# Patient Record
Sex: Female | Born: 1945
Health system: Southern US, Community
[De-identification: ages and names within clinical notes are randomized; demographics above are authoritative.]

## PROBLEM LIST (undated history)

## (undated) DIAGNOSIS — E785 Hyperlipidemia, unspecified: Secondary | ICD-10-CM

## (undated) DIAGNOSIS — K219 Gastro-esophageal reflux disease without esophagitis: Secondary | ICD-10-CM

## (undated) DIAGNOSIS — F32A Depression, unspecified: Secondary | ICD-10-CM

## (undated) DIAGNOSIS — H269 Unspecified cataract: Secondary | ICD-10-CM

## (undated) DIAGNOSIS — E0500 Thyrotoxicosis with diffuse goiter without thyrotoxic crisis or storm: Secondary | ICD-10-CM

## (undated) DIAGNOSIS — M1990 Unspecified osteoarthritis, unspecified site: Secondary | ICD-10-CM

## (undated) DIAGNOSIS — I10 Essential (primary) hypertension: Secondary | ICD-10-CM

## (undated) DIAGNOSIS — T7840XA Allergy, unspecified, initial encounter: Secondary | ICD-10-CM

## (undated) DIAGNOSIS — F419 Anxiety disorder, unspecified: Secondary | ICD-10-CM

## (undated) HISTORY — DX: Unspecified cataract: H26.9

## (undated) HISTORY — DX: Gastro-esophageal reflux disease without esophagitis: K21.9

## (undated) HISTORY — DX: Essential (primary) hypertension: I10

## (undated) HISTORY — DX: Allergy, unspecified, initial encounter: T78.40XA

## (undated) HISTORY — DX: Depression, unspecified: F32.A

## (undated) HISTORY — DX: Unspecified osteoarthritis, unspecified site: M19.90

## (undated) HISTORY — DX: Anxiety disorder, unspecified: F41.9

## (undated) HISTORY — DX: Hyperlipidemia, unspecified: E78.5

## (undated) HISTORY — DX: Thyrotoxicosis with diffuse goiter without thyrotoxic crisis or storm: E05.00

---

## 1998-10-23 ENCOUNTER — Other Ambulatory Visit: Admission: RE | Admit: 1998-10-23 | Discharge: 1998-10-23 | Payer: Self-pay | Admitting: Gynecology

## 1999-11-18 ENCOUNTER — Other Ambulatory Visit: Admission: RE | Admit: 1999-11-18 | Discharge: 1999-11-18 | Payer: Self-pay | Admitting: Gynecology

## 2001-02-16 ENCOUNTER — Other Ambulatory Visit: Admission: RE | Admit: 2001-02-16 | Discharge: 2001-02-16 | Payer: Self-pay | Admitting: Gynecology

## 2002-05-31 ENCOUNTER — Other Ambulatory Visit: Admission: RE | Admit: 2002-05-31 | Discharge: 2002-05-31 | Payer: Self-pay | Admitting: Gynecology

## 2002-11-23 ENCOUNTER — Ambulatory Visit (HOSPITAL_COMMUNITY): Admission: RE | Admit: 2002-11-23 | Discharge: 2002-11-23 | Payer: Self-pay | Admitting: Endocrinology

## 2003-06-26 ENCOUNTER — Other Ambulatory Visit: Admission: RE | Admit: 2003-06-26 | Discharge: 2003-06-26 | Payer: Self-pay | Admitting: Gynecology

## 2003-11-04 HISTORY — PX: POLYPECTOMY: SHX149

## 2003-12-05 ENCOUNTER — Ambulatory Visit (HOSPITAL_COMMUNITY): Admission: RE | Admit: 2003-12-05 | Discharge: 2003-12-05 | Payer: Self-pay | Admitting: Family Medicine

## 2004-09-17 ENCOUNTER — Other Ambulatory Visit: Admission: RE | Admit: 2004-09-17 | Discharge: 2004-09-17 | Payer: Self-pay | Admitting: Gynecology

## 2009-09-20 ENCOUNTER — Encounter (INDEPENDENT_AMBULATORY_CARE_PROVIDER_SITE_OTHER): Payer: Self-pay | Admitting: *Deleted

## 2009-10-15 ENCOUNTER — Encounter (INDEPENDENT_AMBULATORY_CARE_PROVIDER_SITE_OTHER): Payer: Self-pay | Admitting: *Deleted

## 2009-10-16 ENCOUNTER — Ambulatory Visit: Payer: Self-pay | Admitting: Gastroenterology

## 2009-11-03 HISTORY — PX: TOTAL KNEE ARTHROPLASTY: SHX125

## 2009-11-06 ENCOUNTER — Ambulatory Visit: Payer: Self-pay | Admitting: Gastroenterology

## 2009-11-08 ENCOUNTER — Encounter: Payer: Self-pay | Admitting: Gastroenterology

## 2010-12-05 NOTE — Procedures (Signed)
Summary: Colonoscopy  Patient: Heather Wilson Note: All result statuses are Final unless otherwise noted.  Tests: (1) Colonoscopy (COL)   COL Colonoscopy           DONE     [facility]     [address]     [place], [place]  [address]           COLONOSCOPY PROCEDURE REPORT           PATIENT:  Heather Wilson, Heather Wilson  MR#:  [id number]     BIRTHDATE:  04/08/1946, 63 yrs. old  GENDER:  female           ENDOSCOPIST:  Judie Petit T. Russella Dar, MD, [facility]           PROCEDURE DATE:  11/06/2009     PROCEDURE:  Colonoscopy with snare polypectomy, and with hot     biopsy     ASA CLASS:  Class II     INDICATIONS:  1) follow-up of polyp, adenomatous polyp, 2.2005.           MEDICATIONS:   Fentanyl 75 mcg IV, Versed 8 mg IV           DESCRIPTION OF PROCEDURE:   After the risks benefits and     alternatives of the procedure were thoroughly explained, informed     consent was obtained.  Digital rectal exam was performed and     revealed no abnormalities.   The LB PCF-H180AL [id number] endoscope     was introduced through the anus and advanced to the cecum, which     was identified by both the appendix and ileocecal valve, without     limitations.  The quality of the prep was good, using MoviPrep.     The instrument was then slowly withdrawn as the colon was fully     examined.     <<PROCEDUREIMAGES>>           FINDINGS:  A sessile polyp was found in the cecum. It was 10 mm in     size. With hot biopsy forceps, the polyp was cauterized in several     spots, biopsies were obtained and sent to pathology. Piecemeal     polypectomy. A sessile polyp was found in the ascending colon. It     was 4 mm in size. Polyp was snared without cautery. Retrieval was     successful. A sessile polyp was found in the mid transverse colon.     It was 4 mm in size. Polyp was snared without cautery. Retrieval     was successful. Mild diverticulosis was found in the sigmoid     colon. This was otherwise a normal examination of  the colon.     Retroflexed views in the rectum revealed no abnormalities.  The time     to cecum =  3.5  minutes. The scope was then withdrawn (time =  12     min) from the patient and the procedure completed.           COMPLICATIONS:  None           ENDOSCOPIC IMPRESSION:     1) 10 mm sessile polyp in the cecum     2) 4 mm sessile polyp in the ascending colon     3) 4 mm sessile polyp in the mid transverse colon     4) Mild diverticulosis in the sigmoid colon           RECOMMENDATIONS:  1) No aspirin or NSAID's for 2 weeks     2) Repeat Colonscopy in 2-5 years. If cecal  polyp is     adenomatous 2 years otherwise 5 years.     3) high fiber diet           Teighlor Korson T. Russella Dar, MD, [facility]           CC: Holley Bouche, MD           n.     Rosalie DoctorVenita Lick. Henryetta Corriveau at 11/06/2009 10:09 AM           Pricilla Riffle, [phone]  Note: An exclamation mark (!) indicates a result that was not dispersed into the flowsheet. Document Creation Date: 11/06/2009 10:09 AM _______________________________________________________________________  (1) Order result status: Final Collection or observation date-time: 11/06/2009 10:02 Requested date-time:  Receipt date-time:  Reported date-time:  Referring Physician:   Ordering Physician: Claudette Head [phone]) Specimen Source:  Source: Launa Grill Order Number: [id number] Lab site:   Appended Document: Colonoscopy     Procedures Next Due Date:    Colonoscopy: 11/2011

## 2010-12-05 NOTE — Letter (Signed)
Summary: Patient Notice- Polyp Results  [facility]  [address] [place], [place] [address]   Phone: [phone]  Fax: [phone]        November 08, 2009 MRN: [id number]    Heather Wilson [address] [place], [place]  [address]    Dear Ms. CUTTER,  I am pleased to inform you that the colon polyp(s) removed during your recent colonoscopy was (were) found to be benign (no cancer detected) upon pathologic examination.  I recommend you have a repeat colonoscopy examination in 2 years to look for recurrent polyps, as having colon polyps increases your risk for having recurrent polyps or even colon cancer in the future.  Should you develop new or worsening symptoms of abdominal pain, bowel habit changes or bleeding from the rectum or bowels, please schedule an evaluation with either your primary care physician or with me.  Continue treatment plan as outlined the day of your exam.  Please call us if you are having persistent problems or have questions about your condition that have not been fully answered at this time.  Sincerely,  Meryl Dare MD [facility]  This letter has been electronically signed by your physician.  Appended Document: Patient Notice- Polyp Results Letter mailed 1.7.11

## 2011-11-04 HISTORY — PX: COLONOSCOPY: SHX174

## 2011-12-04 ENCOUNTER — Encounter: Payer: Self-pay | Admitting: Gastroenterology

## 2012-03-03 ENCOUNTER — Encounter: Payer: Self-pay | Admitting: Gastroenterology

## 2012-04-12 ENCOUNTER — Ambulatory Visit (AMBULATORY_SURGERY_CENTER): Payer: Managed Care, Other (non HMO) | Admitting: *Deleted

## 2012-04-12 ENCOUNTER — Encounter: Payer: Self-pay | Admitting: Gastroenterology

## 2012-04-12 VITALS — Ht 65.0 in | Wt 179.7 lb

## 2012-04-12 DIAGNOSIS — Z1211 Encounter for screening for malignant neoplasm of colon: Secondary | ICD-10-CM

## 2012-04-12 MED ORDER — PEG-KCL-NACL-NASULF-NA ASC-C 100 G PO SOLR: ORAL | Status: DC

## 2012-04-12 NOTE — Progress Notes (Signed)
No problems with eggs or soy products

## 2012-04-26 ENCOUNTER — Encounter: Payer: Self-pay | Admitting: Gastroenterology

## 2012-04-26 ENCOUNTER — Ambulatory Visit (AMBULATORY_SURGERY_CENTER): Payer: Managed Care, Other (non HMO) | Admitting: Gastroenterology

## 2012-04-26 VITALS — BP 165/84 | HR 91 | Temp 96.5°F | Resp 20 | Ht 66.0 in | Wt 130.0 lb

## 2012-04-26 DIAGNOSIS — Z1211 Encounter for screening for malignant neoplasm of colon: Secondary | ICD-10-CM

## 2012-04-26 DIAGNOSIS — K921 Melena: Secondary | ICD-10-CM

## 2012-04-26 DIAGNOSIS — Z86010 Personal history of colonic polyps: Secondary | ICD-10-CM

## 2012-04-26 MED ORDER — SODIUM CHLORIDE 0.9 % IV SOLN: 500.0000 mL | INTRAVENOUS | Status: DC

## 2012-04-26 NOTE — Patient Instructions (Addendum)
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
## 2012-04-26 NOTE — Op Note (Signed)
[facility] [address] [place], [place]  [address]  COLONOSCOPY PROCEDURE REPORT  PATIENT:  Heather Wilson, Heather Wilson  MR#:  [id number] BIRTHDATE:  03/11/1946, 66 yrs. old  GENDER:  female ENDOSCOPIST:  Judie Petit T. Russella Dar, MD, [facility]  PROCEDURE DATE:  04/26/2012 PROCEDURE:  Colonoscopy 62[address] ASA CLASS:  Class II INDICATIONS:  1) surveillance and high-risk screening  2) history of pre-cancerous (adenomatous) colon polyps : 12/2003 MEDICATIONS:   MAC sedation, administered by CRNA, propofol (Diprivan) 250 mg IV DESCRIPTION OF PROCEDURE:   After the risks benefits and alternatives of the procedure were thoroughly explained, informed consent was obtained.  Digital rectal exam was performed and revealed no abnormalities.   The LB PCF-Q180AL [id number] endoscope was introduced through the anus and advanced to the cecum, which was identified by both the appendix and ileocecal valve, without limitations.  The quality of the prep was good, using MoviPrep. The instrument was then slowly withdrawn as the colon was fully examined. <<PROCEDUREIMAGES>> FINDINGS:  Moderate diverticulosis was found in the sigmoid colon. Otherwise normal colonoscopy without other polyps, masses, vascular ectasias, or inflammatory changes.  Retroflexed views in the rectum revealed no abnormalities. The time to cecum =  3.75 minutes. The scope was then withdrawn (time =  9.5  min) from the patient and the procedure completed.  COMPLICATIONS:  None  ENDOSCOPIC IMPRESSION: 1) Moderate diverticulosis in the sigmoid colon  RECOMMENDATIONS: 1) High fiber diet with liberal fluid intake. 2) Repeat Colonoscopy in 5 years.  Venita Lick. Russella Dar, MD, [facility]  CC:  Holley Bouche, MD  n. Rosalie DoctorVenita Lick. Wilgus Deyton at 04/26/2012 09:29 AM  Pricilla Riffle, [phone]

## 2012-04-27 ENCOUNTER — Telehealth: Payer: Self-pay | Admitting: *Deleted

## 2012-04-27 NOTE — Telephone Encounter (Signed)
  Follow up Call-  Call back number 04/26/2012  Post procedure Call Back phone  # [phone]  Permission to leave phone message Yes     Left message on answering machine to call back if having problems or has questions,

## 2013-02-28 ENCOUNTER — Other Ambulatory Visit: Payer: Self-pay | Admitting: Gynecology

## 2014-08-15 ENCOUNTER — Encounter: Payer: Self-pay | Admitting: Gastroenterology

## 2014-08-31 ENCOUNTER — Encounter: Payer: Self-pay | Admitting: Neurology

## 2014-08-31 ENCOUNTER — Ambulatory Visit (INDEPENDENT_AMBULATORY_CARE_PROVIDER_SITE_OTHER): Payer: Commercial Managed Care - HMO | Admitting: Neurology

## 2014-08-31 VITALS — BP 128/80 | HR 98 | Resp 16 | Ht 64.5 in | Wt 180.0 lb

## 2014-08-31 DIAGNOSIS — I10 Essential (primary) hypertension: Secondary | ICD-10-CM | POA: Insufficient documentation

## 2014-08-31 DIAGNOSIS — E785 Hyperlipidemia, unspecified: Secondary | ICD-10-CM | POA: Insufficient documentation

## 2014-08-31 DIAGNOSIS — R9401 Abnormal electroencephalogram [EEG]: Secondary | ICD-10-CM | POA: Insufficient documentation

## 2014-08-31 DIAGNOSIS — F23 Brief psychotic disorder: Secondary | ICD-10-CM

## 2014-08-31 DIAGNOSIS — E0500 Thyrotoxicosis with diffuse goiter without thyrotoxic crisis or storm: Secondary | ICD-10-CM | POA: Insufficient documentation

## 2014-08-31 NOTE — Patient Instructions (Signed)
1. If symptoms change or recur, please call our office and we will plan for a prolonged home EEG 2. Continue all your medications 3. Follow-up in 3 months
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
## 2014-12-01 ENCOUNTER — Encounter: Payer: Self-pay | Admitting: Neurology

## 2014-12-01 ENCOUNTER — Ambulatory Visit (INDEPENDENT_AMBULATORY_CARE_PROVIDER_SITE_OTHER): Payer: Commercial Managed Care - HMO | Admitting: Neurology

## 2014-12-01 ENCOUNTER — Ambulatory Visit: Payer: Commercial Managed Care - HMO | Admitting: Neurology

## 2014-12-01 VITALS — BP 112/64 | HR 108 | Resp 16 | Ht 64.5 in | Wt 180.0 lb

## 2014-12-01 DIAGNOSIS — R9401 Abnormal electroencephalogram [EEG]: Secondary | ICD-10-CM | POA: Diagnosis not present

## 2014-12-01 DIAGNOSIS — F23 Brief psychotic disorder: Secondary | ICD-10-CM | POA: Diagnosis not present

## 2014-12-01 NOTE — Patient Instructions (Signed)
1. Continue all your medications prescribed by psychiatrist 2. Follow-up in 6 months, call our office for any change in symptoms
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
## 2015-01-09 DIAGNOSIS — F0634 Mood disorder due to known physiological condition with mixed features: Secondary | ICD-10-CM | POA: Diagnosis not present

## 2015-01-09 DIAGNOSIS — F419 Anxiety disorder, unspecified: Secondary | ICD-10-CM | POA: Diagnosis not present

## 2015-02-07 DIAGNOSIS — F0634 Mood disorder due to known physiological condition with mixed features: Secondary | ICD-10-CM | POA: Diagnosis not present

## 2015-02-07 DIAGNOSIS — F419 Anxiety disorder, unspecified: Secondary | ICD-10-CM | POA: Diagnosis not present

## 2015-04-17 DIAGNOSIS — F0634 Mood disorder due to known physiological condition with mixed features: Secondary | ICD-10-CM | POA: Diagnosis not present

## 2015-04-30 ENCOUNTER — Other Ambulatory Visit (HOSPITAL_COMMUNITY): Payer: Self-pay | Admitting: Psychiatry

## 2015-05-04 ENCOUNTER — Other Ambulatory Visit (HOSPITAL_COMMUNITY): Payer: Self-pay | Admitting: Psychiatry

## 2015-05-16 DIAGNOSIS — Z1231 Encounter for screening mammogram for malignant neoplasm of breast: Secondary | ICD-10-CM | POA: Diagnosis not present

## 2015-05-16 DIAGNOSIS — Z1289 Encounter for screening for malignant neoplasm of other sites: Secondary | ICD-10-CM | POA: Diagnosis not present

## 2015-05-30 ENCOUNTER — Other Ambulatory Visit (HOSPITAL_COMMUNITY): Payer: Self-pay | Admitting: Psychiatry

## 2015-06-01 ENCOUNTER — Ambulatory Visit (INDEPENDENT_AMBULATORY_CARE_PROVIDER_SITE_OTHER): Payer: Commercial Managed Care - HMO | Admitting: Neurology

## 2015-06-01 ENCOUNTER — Encounter: Payer: Self-pay | Admitting: Neurology

## 2015-06-01 VITALS — BP 118/70 | HR 116 | Ht 65.0 in | Wt 176.0 lb

## 2015-06-01 DIAGNOSIS — R9401 Abnormal electroencephalogram [EEG]: Secondary | ICD-10-CM

## 2015-06-01 DIAGNOSIS — F23 Brief psychotic disorder: Secondary | ICD-10-CM | POA: Diagnosis not present

## 2015-06-01 NOTE — Progress Notes (Signed)
Note routed to Dr Casimiro Needle and Dr Kenton Kingfisher.

## 2015-06-01 NOTE — Patient Instructions (Signed)
1. Schedule 1-hour sleep-deprived EEG 2. Continue all your current medications, discuss Depakote with your psychiatrist 3. Follow-up in 3 months, call for any problems
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
## 2015-06-06 ENCOUNTER — Ambulatory Visit (INDEPENDENT_AMBULATORY_CARE_PROVIDER_SITE_OTHER): Payer: Commercial Managed Care - HMO | Admitting: Neurology

## 2015-06-06 DIAGNOSIS — R9401 Abnormal electroencephalogram [EEG]: Secondary | ICD-10-CM

## 2015-06-06 DIAGNOSIS — F23 Brief psychotic disorder: Secondary | ICD-10-CM | POA: Diagnosis not present

## 2015-06-07 DIAGNOSIS — F3170 Bipolar disorder, currently in remission, most recent episode unspecified: Secondary | ICD-10-CM | POA: Diagnosis not present

## 2015-06-07 DIAGNOSIS — I10 Essential (primary) hypertension: Secondary | ICD-10-CM | POA: Diagnosis not present

## 2015-06-07 DIAGNOSIS — E785 Hyperlipidemia, unspecified: Secondary | ICD-10-CM | POA: Diagnosis not present

## 2015-06-07 DIAGNOSIS — G40209 Localization-related (focal) (partial) symptomatic epilepsy and epileptic syndromes with complex partial seizures, not intractable, without status epilepticus: Secondary | ICD-10-CM | POA: Diagnosis not present

## 2015-06-07 DIAGNOSIS — F419 Anxiety disorder, unspecified: Secondary | ICD-10-CM | POA: Diagnosis not present

## 2015-06-07 NOTE — Procedures (Signed)
ELECTROENCEPHALOGRAM REPORT  Date of Study: 06/06/2015  Patient's Name: Heather Wilson MRN: [id number] Date of Birth: 1946-09-16  Referring Provider: Dr. Ellouise Newer  Clinical History: This is a 69 year old woman who had an episode of mixed manic crisis with paranoia last August to September 2015. As part of her evaluation, she had 2 EEGs which were abnormal, showing rare right frontotemporal epileptiform discharges. Interested in tapering Depakote, EEG to assess for focal abnormalities.  Medications: Depakote, Risperdal, Zoloft, Zocor  Technical Summary: A multichannel digital 1-hour sleep-deprived  EEG recording measured by the international 10-20 system with electrodes applied with paste and impedances below 5000 ohms performed in our laboratory with EKG monitoring in an awake and asleep patient.  Hyperventilation and photic stimulation were performed.  The digital EEG was referentially recorded, reformatted, and digitally filtered in a variety of bipolar and referential montages for optimal display.    Description: The patient is awake and asleep during the recording.  During maximal wakefulness, there is a symmetric, medium voltage 9 Hz posterior dominant rhythm that attenuates with eye opening.  There is occasional focal 3-4 Hz slowing seen over the left temporal region. During drowsiness and sleep, there is an increase in theta and delta slowing of the background, with shifting asymmetry seen over the bilateral temporal regions.  Vertex waves and symmetric sleep spindles were seen.  Hyperventilation and photic stimulation did not elicit any abnormalities.  There were no epileptiform discharges or electrographic seizures seen.    EKG lead was unremarkable.  Impression: This 1-hour awake and asleep EEG is mildly abnormal due to occasional focal slowing over the left temporal region.  Clinical Correlation of the above findings indicates focal cerebral dysfunction over the left temporal  region suggestive of underlying structural or physiologic abnormality. There were no epileptiform discharges seen in this study. The absence of epileptiform discharges does not exclude a clinical diagnosis of epilepsy.  If further clinical questions remain, prolonged EEG may be helpful.  Clinical correlation is advised.   Ellouise Newer, M.D.

## 2015-06-15 ENCOUNTER — Telehealth: Payer: Self-pay | Admitting: Neurology

## 2015-06-15 NOTE — Telephone Encounter (Signed)
Discussed EEG results, no epileptiform discharges. Ok to reduce Depakote to 500mg  qhs from neurologic standpoint, however she needs to monitor mood, since this was started for mood stabilization. She will discuss this with her psychiatrist as well.

## 2015-06-25 DIAGNOSIS — F0634 Mood disorder due to known physiological condition with mixed features: Secondary | ICD-10-CM | POA: Diagnosis not present

## 2015-08-20 DIAGNOSIS — F0634 Mood disorder due to known physiological condition with mixed features: Secondary | ICD-10-CM | POA: Diagnosis not present

## 2015-09-03 ENCOUNTER — Ambulatory Visit (INDEPENDENT_AMBULATORY_CARE_PROVIDER_SITE_OTHER): Payer: Commercial Managed Care - HMO | Admitting: Neurology

## 2015-09-03 ENCOUNTER — Encounter: Payer: Self-pay | Admitting: Neurology

## 2015-09-03 VITALS — BP 112/70 | HR 103 | Ht 65.0 in | Wt 179.0 lb

## 2015-09-03 DIAGNOSIS — F23 Brief psychotic disorder: Secondary | ICD-10-CM | POA: Diagnosis not present

## 2015-09-03 NOTE — Patient Instructions (Signed)
1. Reduce Depakote to 250mg  at bedtime 2. If mood worsens, call your psychiatrist 3. Follow-up in 3 months, if continuing to do well, we will plan to stop Depakote at that time
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
## 2015-10-04 ENCOUNTER — Other Ambulatory Visit (HOSPITAL_COMMUNITY): Payer: Self-pay | Admitting: Psychiatry

## 2015-10-22 DIAGNOSIS — F0634 Mood disorder due to known physiological condition with mixed features: Secondary | ICD-10-CM | POA: Diagnosis not present

## 2015-12-04 ENCOUNTER — Ambulatory Visit (INDEPENDENT_AMBULATORY_CARE_PROVIDER_SITE_OTHER): Payer: Commercial Managed Care - HMO | Admitting: Neurology

## 2015-12-04 ENCOUNTER — Encounter: Payer: Self-pay | Admitting: Neurology

## 2015-12-04 VITALS — BP 124/90 | HR 101 | Ht 65.0 in | Wt 178.0 lb

## 2015-12-04 DIAGNOSIS — F23 Brief psychotic disorder: Secondary | ICD-10-CM
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
## 2015-12-04 NOTE — Patient Instructions (Signed)
1. Stop Depakote 2. Follow-up in 8 months, call for any changes

## 2015-12-10 DIAGNOSIS — E785 Hyperlipidemia, unspecified: Secondary | ICD-10-CM | POA: Diagnosis not present

## 2015-12-10 DIAGNOSIS — F419 Anxiety disorder, unspecified: Secondary | ICD-10-CM | POA: Diagnosis not present

## 2015-12-10 DIAGNOSIS — G40209 Localization-related (focal) (partial) symptomatic epilepsy and epileptic syndromes with complex partial seizures, not intractable, without status epilepticus: Secondary | ICD-10-CM | POA: Diagnosis not present

## 2015-12-10 DIAGNOSIS — I10 Essential (primary) hypertension: Secondary | ICD-10-CM | POA: Diagnosis not present

## 2015-12-10 DIAGNOSIS — Z23 Encounter for immunization: Secondary | ICD-10-CM | POA: Diagnosis not present

## 2015-12-17 DIAGNOSIS — F0634 Mood disorder due to known physiological condition with mixed features: Secondary | ICD-10-CM | POA: Diagnosis not present

## 2016-02-04 DIAGNOSIS — F0634 Mood disorder due to known physiological condition with mixed features: Secondary | ICD-10-CM | POA: Diagnosis not present

## 2016-03-08 ENCOUNTER — Other Ambulatory Visit (HOSPITAL_COMMUNITY): Payer: Self-pay | Admitting: Psychiatry

## 2016-04-03 ENCOUNTER — Encounter: Payer: Self-pay | Admitting: Family Medicine

## 2016-04-22 DIAGNOSIS — I10 Essential (primary) hypertension: Secondary | ICD-10-CM | POA: Diagnosis not present

## 2016-04-23 DIAGNOSIS — F0634 Mood disorder due to known physiological condition with mixed features: Secondary | ICD-10-CM | POA: Diagnosis not present

## 2016-04-25 ENCOUNTER — Emergency Department (HOSPITAL_COMMUNITY)
Admission: EM | Admit: 2016-04-25 | Discharge: 2016-04-28 | Disposition: A | Discharge status: Home / Self Care | Payer: Commercial Managed Care - HMO | Attending: Emergency Medicine | Admitting: Emergency Medicine

## 2016-04-25 DIAGNOSIS — E785 Hyperlipidemia, unspecified: Secondary | ICD-10-CM | POA: Insufficient documentation

## 2016-04-25 DIAGNOSIS — I10 Essential (primary) hypertension: Secondary | ICD-10-CM | POA: Insufficient documentation

## 2016-04-25 DIAGNOSIS — Z79899 Other long term (current) drug therapy: Secondary | ICD-10-CM | POA: Diagnosis not present

## 2016-04-25 DIAGNOSIS — F29 Unspecified psychosis not due to a substance or known physiological condition: Secondary | ICD-10-CM | POA: Diagnosis not present

## 2016-04-25 DIAGNOSIS — R069 Unspecified abnormalities of breathing: Secondary | ICD-10-CM | POA: Diagnosis not present

## 2016-04-25 DIAGNOSIS — Z7982 Long term (current) use of aspirin: Secondary | ICD-10-CM | POA: Diagnosis not present

## 2016-04-25 DIAGNOSIS — M1990 Unspecified osteoarthritis, unspecified site: Secondary | ICD-10-CM | POA: Insufficient documentation

## 2016-04-25 DIAGNOSIS — F23 Brief psychotic disorder: Secondary | ICD-10-CM | POA: Insufficient documentation

## 2016-04-25 DIAGNOSIS — F410 Panic disorder [episodic paroxysmal anxiety] without agoraphobia: Secondary | ICD-10-CM | POA: Diagnosis present

## 2016-04-25 DIAGNOSIS — F411 Generalized anxiety disorder: Secondary | ICD-10-CM | POA: Diagnosis present

## 2016-04-25 DIAGNOSIS — F332 Major depressive disorder, recurrent severe without psychotic features: Secondary | ICD-10-CM | POA: Diagnosis present

## 2016-04-25 DIAGNOSIS — R45851 Suicidal ideations: Secondary | ICD-10-CM | POA: Diagnosis not present

## 2016-04-25 DIAGNOSIS — F419 Anxiety disorder, unspecified: Secondary | ICD-10-CM | POA: Diagnosis not present

## 2016-04-25 LAB — COMPREHENSIVE METABOLIC PANEL
ALT: 20 U/L (ref 14–54)
AST: 31 U/L (ref 15–41)
Albumin: 4 g/dL (ref 3.5–5.0)
Alkaline Phosphatase: 99 U/L (ref 38–126)
Anion gap: 15 (ref 5–15)
BUN: 12 mg/dL (ref 6–20)
CO2: 23 mmol/L (ref 22–32)
Calcium: 9.2 mg/dL (ref 8.9–10.3)
Chloride: 87 mmol/L — ABNORMAL LOW (ref 101–111)
Creatinine, Ser: 0.69 mg/dL (ref 0.44–1.00)
GFR calc Af Amer: >60 mL/min (ref >60)
GFR calc non Af Amer: >60 mL/min (ref >60)
Glucose, Bld: 165 mg/dL — ABNORMAL HIGH (ref 65–99)
Potassium: 2.1 mmol/L — CL (ref 3.5–5.1)
Sodium: 125 mmol/L — ABNORMAL LOW (ref 135–145)
Total Bilirubin: 0.8 mg/dL (ref 0.3–1.2)
Total Protein: 7.3 g/dL (ref 6.5–8.1)

## 2016-04-25 LAB — CBC
HCT: 34.2 % — ABNORMAL LOW (ref 36.0–46.0)
Hemoglobin: 13.3 g/dL (ref 12.0–15.0)
MCH: 30.6 pg (ref 26.0–34.0)
MCHC: 37.2 g/dL — ABNORMAL HIGH (ref 30.0–36.0)
MCV: 81.8 fL (ref 78.0–100.0)
Platelets: 386 10*3/uL (ref 150–400)
RBC: 4.18 MIL/uL (ref 3.87–5.11)
RDW: 12.9 % (ref 11.5–15.5)
WBC: 7.9 10*3/uL (ref 4.0–10.5)

## 2016-04-25 LAB — SALICYLATE LEVEL: Salicylate Lvl: <4 mg/dL (ref 2.8–30.0)

## 2016-04-25 LAB — ETHANOL

## 2016-04-25 LAB — ACETAMINOPHEN LEVEL: Acetaminophen (Tylenol), Serum: <10 ug/mL — ABNORMAL LOW (ref 10–30)

## 2016-04-25 MED ORDER — LOSARTAN POTASSIUM 50 MG PO TABS
100.0000 mg | ORAL_TABLET | Freq: Every day | ORAL | Status: DC
  Administered 2016-04-26 – 2016-04-27 (×2): 100 mg via ORAL
  Filled 2016-04-25 (×4): qty 2

## 2016-04-25 MED ORDER — SIMVASTATIN 20 MG PO TABS
20.0000 mg | ORAL_TABLET | Freq: Every evening | ORAL | Status: DC
  Administered 2016-04-25 – 2016-04-27 (×3): 20 mg via ORAL
  Filled 2016-04-25 (×5): qty 1

## 2016-04-25 MED ORDER — ONDANSETRON HCL 4 MG PO TABS: 4.0000 mg | ORAL_TABLET | Freq: Three times a day (TID) | ORAL | Status: DC | PRN

## 2016-04-25 MED ORDER — ACETAMINOPHEN 325 MG PO TABS
650.0000 mg | ORAL_TABLET | ORAL | Status: DC | PRN
  Administered 2016-04-25: 650 mg via ORAL
  Filled 2016-04-25: qty 2

## 2016-04-25 MED ORDER — ALUM & MAG HYDROXIDE-SIMETH 200-200-20 MG/5ML PO SUSP: 30.0000 mL | ORAL | Status: DC | PRN

## 2016-04-25 MED ORDER — HYDROCHLOROTHIAZIDE 25 MG PO TABS
25.0000 mg | ORAL_TABLET | Freq: Every day | ORAL | Status: DC
  Administered 2016-04-26 – 2016-04-27 (×2): 25 mg via ORAL
  Filled 2016-04-25 (×4): qty 1

## 2016-04-25 MED ORDER — POTASSIUM CHLORIDE CRYS ER 20 MEQ PO TBCR
20.0000 meq | EXTENDED_RELEASE_TABLET | Freq: Two times a day (BID) | ORAL | Status: DC
  Administered 2016-04-25 – 2016-04-27 (×6): 20 meq via ORAL
  Filled 2016-04-25 (×6): qty 1

## 2016-04-25 MED ORDER — LOSARTAN POTASSIUM-HCTZ 100-25 MG PO TABS: 1.0000 | ORAL_TABLET | Freq: Every day | ORAL | Status: DC

## 2016-04-25 MED ORDER — AMLODIPINE BESYLATE 2.5 MG PO TABS
2.5000 mg | ORAL_TABLET | Freq: Every day | ORAL | Status: DC
  Administered 2016-04-26 – 2016-04-27 (×2): 2.5 mg via ORAL
  Filled 2016-04-25 (×4): qty 1

## 2016-04-25 MED ORDER — SERTRALINE HCL 50 MG PO TABS
100.0000 mg | ORAL_TABLET | Freq: Every day | ORAL | Status: DC
  Administered 2016-04-25 – 2016-04-27 (×3): 100 mg via ORAL
  Filled 2016-04-25 (×3): qty 2

## 2016-04-25 MED ORDER — LORAZEPAM 1 MG PO TABS
1.0000 mg | ORAL_TABLET | Freq: Three times a day (TID) | ORAL | Status: DC | PRN
  Administered 2016-04-27 – 2016-04-28 (×2): 1 mg via ORAL
  Filled 2016-04-25 (×2): qty 1

## 2016-04-25 MED ORDER — ZOLPIDEM TARTRATE 5 MG PO TABS: 5.0000 mg | ORAL_TABLET | Freq: Every evening | ORAL | Status: DC | PRN

## 2016-04-25 NOTE — ED Notes (Signed)
Patient changed into scrubs.Husband taking belongings home
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
## 2016-04-25 NOTE — ED Notes (Signed)
Per EMS, pt from home, started to have anxiety attack this am.  Recently her ativan was switched to Clorazepate which is not helping.

## 2016-04-25 NOTE — BHH Counselor (Signed)
The following facilities have been contacted to seek placement for this pt, with results as noted:  Beds available, information sent, decision pending:  [facility]    Heather Sharrow McNeil, MA OBS Counselor 

## 2016-04-25 NOTE — ED Notes (Signed)
Pt's husband at bedside. He reports that pt has been anxiety problem.  Her doctor changed her anxiety med Ativan to something else Tuesday.  States pt has not been sleeping recently.  States the last time she had an anxiety attack was about 2 years ago.  Asked husband if pt had verbalized SI to him, he denied.

## 2016-04-25 NOTE — ED Notes (Signed)
Pt moved to hall B. Report given to [facility]. No belongings were in unit because pt's husband took them home

## 2016-04-25 NOTE — ED Notes (Signed)
Patient aware we need urine,unable to give specimen at this time

## 2016-04-25 NOTE — ED Notes (Addendum)
Pt states "I don't know, I don't remember, ask my husband."  Pt's husband is not at bedside at this time.  As this nurse keep asking her questions, pt would answer.  Pt reports that she fell today, and started to have SI.  Pt appears mildly anxious.  Pt feels like she is getting ready to have an anxiety attack.

## 2016-04-25 NOTE — ED Notes (Signed)
Bed: [facility] Expected date:  Expected time:  Means of arrival:  Comments: 

## 2016-04-25 NOTE — ED Notes (Signed)
K = 2.1  Notified primary nurse

## 2016-04-25 NOTE — ED Notes (Signed)
Patient ambulated to room with two person assistance.

## 2016-04-25 NOTE — BH Assessment (Addendum)
Assessment Note   Heather Wilson is an 70 y.o. female who came to the ED by ambulance after having shortness of breath due to a panic attack and told her husband she couldn't breathe and needed to go to the hospital. Pt was not able to answer a lot of the assessment questions so most of the information was recorded by her husband who was present at bedside. Husband states that pt has had an "episode" like this before 2 years ago where she was hospitalized due to a manic episode. He states that she was "spending more money than usual, not sleeping, high anxiety and couldn't be still". He states that this time started after she went to her Psychiatrist Dr. Thurman Coyer and he changed her medication 3 days ago. He states that she has been having trouble remembering, is not sleeping and seems disoriented. She stated to him this morning that she did not remember anything that happened last night and barely remembers getting in the ambulance this morning. He states that her sister and father both have mental health issues similar to what she experiences. Pt has been denying SI to her husband but told a doctor earlier that she was having thoughts to hurt herself. When asked if she was hearing voices she stated "I dont know what I hear". Pt seems paranoid and will not answer questions straight forward. She did deny having substance abuse issues or any HI.  Disposition: Per Waylan Boga, NP pt meets inpatient criteria at a [facility] psych facility   Diagnosis: Bipolar 1 Disorder Unspecified   Past Medical History:  Past Medical History  Diagnosis Date  . Hyperlipidemia   . Hypertension   . Anxiety   . Arthritis   . Graves disease     Past Surgical History  Procedure Laterality Date  . Total knee arthroplasty  2011    right    Family History:  Family History  Problem Relation Age of Onset  . Colon cancer Neg Hx   . Stomach cancer Neg Hx     Social History:  reports that she has never smoked. She has  never used smokeless tobacco. She reports that she does not drink alcohol or use illicit drugs.  Additional Social History:  Alcohol / Drug Use History of alcohol / drug use?: No history of alcohol / drug abuse  CIWA: CIWA-Ar BP: 163/76 mmHg Pulse Rate: 100 COWS:    PATIENT STRENGTHS: (choose at least two) Average or above average intelligence Supportive family/friends  Allergies:  Allergies  Allergen Reactions  . Codeine     Increased anxiety    Home Medications:  (Not in a hospital admission)  OB/GYN Status:  No LMP recorded. Patient is postmenopausal.  General Assessment Data Location of Assessment: [facility] TTS Assessment: In system Is this a Tele or Face-to-Face Assessment?: Face-to-Face Is this an Initial Assessment or a Re-assessment for this encounter?: Initial Assessment Marital status: Single Is patient pregnant?: No Pregnancy Status: No Living Arrangements: Spouse/significant other Can pt return to current living arrangement?: Yes Admission Status: Voluntary Is patient capable of signing voluntary admission?: Yes Referral Source: Self/Family/Friend Insurance type:  (Medicare)     Crisis Care Plan Living Arrangements: Spouse/significant other Legal Guardian:  (None) Name of Psychiatrist:  (Dr. Thurman Coyer ) Name of Therapist:  (None)  Education Status Is patient currently in school?: No Highest grade of school patient has completed: [employer]  Risk to self with the past 6 months Suicidal Ideation: Yes-Currently Present Has patient been  a risk to self within the past 6 months prior to admission? : Yes Suicidal Intent: No Has patient had any suicidal intent within the past 6 months prior to admission? : No Is patient at risk for suicide?: Yes Suicidal Plan?: No Has patient had any suicidal plan within the past 6 months prior to admission? : No Access to Means: No What has been your use of drugs/alcohol within the last 12 months?: no substance use Previous  Attempts/Gestures: No How many times?: 0 Other Self Harm Risks: none Triggers for Past Attempts: None known Intentional Self Injurious Behavior: None Family Suicide History: No Recent stressful life event(s): Other (Comment) (medication change ) Persecutory voices/beliefs?:  (stated "I don't know what I'm hearing") Depression: Yes Depression Symptoms: Tearfulness, Feeling worthless/self pity Substance abuse history and/or treatment for substance abuse?: No Suicide prevention information given to non-admitted patients: Not applicable  Risk to Others within the past 6 months Homicidal Ideation: No Does patient have any lifetime risk of violence toward others beyond the six months prior to admission? : No Thoughts of Harm to Others: No Current Homicidal Intent: No Current Homicidal Plan: No Access to Homicidal Means: No Identified Victim: none History of harm to others?: No Assessment of Violence: None Noted Violent Behavior Description: none Does patient have access to weapons?: No Criminal Charges Pending?: No Does patient have a court date: No Is patient on probation?: No  Psychosis Hallucinations: None noted Delusions: None noted  Mental Status Report Appearance/Hygiene: Disheveled Eye Contact: Fair Motor Activity: Psychomotor retardation Speech: Soft, Slow Level of Consciousness: Quiet/awake, Drowsy Mood: Depressed, Anxious Affect: Blunted Anxiety Level: Panic Attacks Most recent panic attack: This AM Thought Processes: Coherent Judgement: Impaired Orientation: Person Obsessive Compulsive Thoughts/Behaviors: Moderate  Cognitive Functioning Concentration: Decreased Memory: Recent Intact, Remote Intact IQ: Average Insight: Poor Impulse Control: Poor Appetite: Poor Weight Loss: 0 Weight Gain: 0 Sleep: Decreased Total Hours of Sleep: 2 Vegetative Symptoms: Staying in bed  ADLScreening [facility]) Patient's cognitive ability adequate to safely  complete daily activities?: Yes Patient able to express need for assistance with ADLs?: Yes Independently performs ADLs?: No  Prior Inpatient Therapy Prior Inpatient Therapy: Yes Prior Therapy Dates: 2015 [facility] Reason for Treatment: bipolar manic episode  Prior Outpatient Therapy Prior Outpatient Therapy: Yes Prior Therapy Dates: ongoing Prior Therapy Facilty/Provider(s): Dr. Leonidas Romberg Reason for Treatment: Medication management Does patient have an ACCT team?: No Does patient have Intensive In-House Services?  : No Does patient have [facility] services? : No Does patient have P4CC services?: No  ADL Screening (condition at time of admission) Patient's cognitive ability adequate to safely complete daily activities?: Yes Is the patient deaf or have difficulty hearing?: No Does the patient have difficulty seeing, even when wearing glasses/contacts?: No Does the patient have difficulty concentrating, remembering, or making decisions?: Yes Patient able to express need for assistance with ADLs?: Yes Does the patient have difficulty dressing or bathing?: Yes Independently performs ADLs?: No Communication: Independent Dressing (OT): Needs assistance Is this a change from baseline?: Change from baseline, expected to last <3days Grooming: Independent Feeding: Independent Bathing: Needs assistance Is this a change from baseline?: Change from baseline, expected to last <3 days Toileting: Needs assistance Is this a change from baseline?: Change from baseline, expected to last <3 days In/Out Bed: Needs assistance Is this a change from baseline?: Change from baseline, expected to last <3 days Walks in Home: Needs assistance Is this a change from baseline?: Change from baseline, expected  to last <3 days Does the patient have difficulty walking or climbing stairs?: Yes Weakness of Legs: Both Weakness of Arms/Hands: None  Home Assistive  Devices/Equipment Home Assistive Devices/Equipment: Wheelchair (pt is weak right now and using a wheelchair- usually gets around independently)  Therapy Consults (therapy consults require a physician order) PT Evaluation Needed: No OT Evalulation Needed: No SLP Evaluation Needed: No Abuse/Neglect Assessment (Assessment to be complete while patient is alone) Physical Abuse: Denies Verbal Abuse: Denies Sexual Abuse: Denies Exploitation of patient/patient's resources: Denies Self-Neglect: Denies Values / Beliefs Cultural Requests During Hospitalization: None Spiritual Requests During Hospitalization: None Consults Spiritual Care Consult Needed: No Social Work Consult Needed: No Regulatory affairs officer (For Healthcare) Does patient have an advance directive?: No Would patient like information on creating an advanced directive?: No - patient declined information Nutrition Screen- [facility] Patient's home diet: Regular Has the patient recently lost weight without trying?: No Has the patient been eating poorly because of a decreased appetite?: No Malnutrition Screening Tool Score: 0  Additional Information 1:1 In Past 12 Months?: No CIRT Risk: No Elopement Risk: No Does patient have medical clearance?: Yes     Disposition:  Disposition Initial Assessment Completed for this Encounter: Yes Disposition of Patient: Inpatient treatment program Type of inpatient treatment program: Adult [facility])  Tawni Melkonian 04/25/2016 3:12 PM

## 2016-04-26 LAB — RAPID URINE DRUG SCREEN, HOSP PERFORMED
AMPHETAMINES: NOT DETECTED
BENZODIAZEPINES: POSITIVE — AB
Barbiturates: NOT DETECTED
Cocaine: NOT DETECTED
OPIATES: NOT DETECTED
TETRAHYDROCANNABINOL: NOT DETECTED

## 2016-04-27 ENCOUNTER — Encounter (HOSPITAL_COMMUNITY): Payer: Self-pay | Admitting: Registered Nurse

## 2016-04-27 DIAGNOSIS — F411 Generalized anxiety disorder: Secondary | ICD-10-CM | POA: Diagnosis present

## 2016-04-27 DIAGNOSIS — F332 Major depressive disorder, recurrent severe without psychotic features: Secondary | ICD-10-CM | POA: Diagnosis present

## 2016-04-27 DIAGNOSIS — F410 Panic disorder [episodic paroxysmal anxiety] without agoraphobia: Secondary | ICD-10-CM | POA: Diagnosis present

## 2016-04-27 MED ORDER — POTASSIUM CHLORIDE CRYS ER 20 MEQ PO TBCR
20.0000 meq | EXTENDED_RELEASE_TABLET | Freq: Every day | ORAL | Status: DC
Start: 2016-04-27 — End: 2016-08-06

## 2016-04-27 MED ORDER — LORAZEPAM 1 MG PO TABS: 1.0000 mg | ORAL_TABLET | Freq: Two times a day (BID) | ORAL | Status: DC | PRN

## 2016-04-27 NOTE — Progress Notes (Signed)
CSW contacted the patient's spouse to inform him the patient will be discharged.  Patient's spouse states he cannot pick her up tonight, but will be by tomorrow morning by 0900 to pick up patient and take her home.  Spouse thanks staff and states he is glad she is doing better.  [place] Disposition CSW [phone]

## 2016-04-27 NOTE — Consult Note (Signed)
[facility] Consult   Reason for Consult:  Shortness of breath and panic attack Referring Physician:  EDP Patient Identification: Heather Wilson MRN:  [id number] Principal Diagnosis: GAD (generalized anxiety disorder) Diagnosis:   Patient Active Problem List   Diagnosis Date Noted  . GAD (generalized anxiety disorder) [F41.1] 04/27/2016  . Panic attack [F41.0] 04/27/2016  . MDD (major depressive disorder), recurrent severe, without psychosis ([place]) [F33.2] 04/27/2016  . Abnormal EEG [R94.01] 08/31/2014  . Brief psychotic disorder [F23] 08/31/2014  . Essential hypertension [I10] 08/31/2014  . Hyperlipidemia [E78.5] 08/31/2014  . Graves disease [E05.00] 08/31/2014    Total Time spent with patient: 20 minutes  Subjective:   Heather Wilson is a 70 y.o. female patient presented to [facility] via EMS with shortness of breath related to panic attack and worsening anxiety.  HPI:  Patient seen by this provider and chart reviewed 04/27/2016.  On evaluation:  Heather Wilson reports that she is feeling better today.  Patient reports that over the last few weeks she has just started to feel overwhelmed with having a lot of things on her mind.  States that she felt like she needed to clean; and wanted her daughter to get things that she had packed wanted them to move.  Patient states with her anxiety "things just got bigger and bigger until I had the panic attack." Patient also states that she had not slept and was feeling tired. Today the patient is alert, oriented and able to carry a conversation.  States that she is feeling better and does better when she gets sleep.  At this time patient denies suicidal/homicidal ideation, psychosis, and paranoia.  Patient has outpatient services for medication management with Dr. Casimiro Needle  Social Work spoke with husband of patient and was informed the following medication changed on Tuesday and Wednesday no sleep Thursday got and started cleaning; Friday  Panic attack and wanted to come to the hospital.  Informed that medications adjusted and that wife would be able to follow up with outpatient provider.       Past Psychiatric History: See H&P  Risk to Self: Suicidal Ideation: Yes-Currently Present Suicidal Intent: No Is patient at risk for suicide?: Yes Suicidal Plan?: No Access to Means: No What has been your use of drugs/alcohol within the last 12 months?: no substance use How many times?: 0 Other Self Harm Risks: none Triggers for Past Attempts: None known Intentional Self Injurious Behavior: None Risk to Others: Homicidal Ideation: No Thoughts of Harm to Others: No Current Homicidal Intent: No Current Homicidal Plan: No Access to Homicidal Means: No Identified Victim: none History of harm to others?: No Assessment of Violence: None Noted Violent Behavior Description: none Does patient have access to weapons?: No Criminal Charges Pending?: No Does patient have a court date: No Prior Inpatient Therapy: Prior Inpatient Therapy: Yes Prior Therapy Dates: 2015 [facility] Reason for Treatment: bipolar manic episode Prior Outpatient Therapy: Prior Outpatient Therapy: Yes Prior Therapy Dates: ongoing Prior Therapy Facilty/Provider(s): Dr. Leonidas Romberg Reason for Treatment: Medication management Does patient have an ACCT team?: No Does patient have Intensive In-House Services?  : No Does patient have [facility] services? : No Does patient have P4CC services?: No  Past Medical History:  Past Medical History  Diagnosis Date  . Hyperlipidemia   . Hypertension   . Anxiety   . Arthritis   . Graves disease     Past Surgical History  Procedure Laterality Date  . Total knee  arthroplasty  2011    right   Family History:  Family History  Problem Relation Age of Onset  . Colon cancer Neg Hx   . Stomach cancer Neg Hx    Family Psychiatric  History: Denies Social History:  History  Alcohol Use  No     History  Drug Use No    Social History   Social History  . Marital Status: Married    Spouse Name: N/A  . Number of Children: N/A  . Years of Education: N/A   Social History Main Topics  . Smoking status: Never Smoker   . Smokeless tobacco: Never Used  . Alcohol Use: No  . Drug Use: No  . Sexual Activity: Not Asked   Other Topics Concern  . None   Social History Narrative   Additional Social History:    Allergies:   Allergies  Allergen Reactions  . Codeine     Increased anxiety    Labs:  Results for orders placed or performed during the hospital encounter of 04/25/16 (from the past 48 hour(s))  Rapid urine drug screen (hospital performed)     Status: Abnormal   Collection Time: 04/26/16  5:08 AM  Result Value Ref Range   Opiates NONE DETECTED NONE DETECTED   Cocaine NONE DETECTED NONE DETECTED   Benzodiazepines POSITIVE (A) NONE DETECTED   Amphetamines NONE DETECTED NONE DETECTED   Tetrahydrocannabinol NONE DETECTED NONE DETECTED   Barbiturates NONE DETECTED NONE DETECTED    Comment:        DRUG SCREEN FOR MEDICAL PURPOSES ONLY.  IF CONFIRMATION IS NEEDED FOR ANY PURPOSE, NOTIFY LAB WITHIN 5 DAYS.        LOWEST DETECTABLE LIMITS FOR URINE DRUG SCREEN Drug Class       Cutoff (ng/mL) Amphetamine      1000 Barbiturate      200 Benzodiazepine   [id number] Tricyclics       [id number] Opiates          300 Cocaine          300 THC              50     Current Facility-Administered Medications  Medication Dose Route Frequency Provider Last Rate Last Dose  . acetaminophen (TYLENOL) tablet 650 mg  650 mg Oral Q4H PRN Leonard Schwartz, MD   650 mg at 04/25/16 2027  . alum & mag hydroxide-simeth (MAALOX/MYLANTA) 200-200-20 MG/5ML suspension 30 mL  30 mL Oral PRN Leonard Schwartz, MD      . amLODipine (NORVASC) tablet 2.5 mg  2.5 mg Oral Daily Leonard Schwartz, MD   2.5 mg at 04/26/16 0909  . losartan (COZAAR) tablet 100 mg  100 mg Oral Daily Leonard Schwartz, MD   100 mg at  04/26/16 [id number]   And  . hydrochlorothiazide (HYDRODIURIL) tablet 25 mg  25 mg Oral Daily Leonard Schwartz, MD   25 mg at 04/26/16 0909  . LORazepam (ATIVAN) tablet 1 mg  1 mg Oral Q8H PRN Leonard Schwartz, MD      . ondansetron [facility]) tablet 4 mg  4 mg Oral Q8H PRN Leonard Schwartz, MD      . potassium chloride SA (K-DUR,KLOR-CON) CR tablet 20 mEq  20 mEq Oral BID Leonard Schwartz, MD   20 mEq at 04/27/16 1300  . sertraline (ZOLOFT) tablet 100 mg  100 mg Oral Daily Leonard Schwartz, MD   100 mg at 04/27/16 1257  . simvastatin (ZOCOR) tablet 20 mg  20  mg Oral QPM Leonard Schwartz, MD   20 mg at 04/26/16 1711  . zolpidem (AMBIEN) tablet 5 mg  5 mg Oral QHS PRN Leonard Schwartz, MD       Current Outpatient Prescriptions  Medication Sig Dispense Refill  . acetaminophen (TYLENOL) 500 MG tablet Take 500-1,000 mg by mouth every 6 (six) hours as needed for moderate pain or headache.    Marland Kitchen amLODipine (NORVASC) 2.5 MG tablet Take 2.5 mg by mouth daily.    Marland Kitchen aspirin 81 MG tablet Take 81 mg by mouth daily.    . clorazepate (TRANXENE) 3.75 MG tablet Take 3.75 mg by mouth 2 (two) times daily.  4  . ergocalciferol (VITAMIN D2) 50000 UNITS capsule Take 50,000 Units by mouth once a week. mondays    . losartan-hydrochlorothiazide (HYZAAR) 100-25 MG tablet Take 1 tablet by mouth daily.    . sertraline (ZOLOFT) 100 MG tablet 100 mg. Take 1 tablet daily    . simvastatin (ZOCOR) 20 MG tablet Take 20 mg by mouth every evening. Take 1 tablet daily  6    Musculoskeletal: Strength & Muscle Tone: within normal limits Gait & Station: normal Patient leans: N/A  Psychiatric Specialty Exam: Physical Exam  Constitutional: She is oriented to person, place, and time.  Neck: Normal range of motion.  Musculoskeletal: Normal range of motion.  Neurological: She is alert and oriented to person, place, and time.  Skin: Skin is warm and dry.  Psychiatric: Her speech is normal. Her mood appears anxious. She is not agitated and not withdrawn.  Thought content is not paranoid and not delusional. She expresses impulsivity. She expresses no homicidal and no suicidal ideation.    Review of Systems  Psychiatric/Behavioral: The patient is nervous/anxious (Stable) and has insomnia (Improved).   All other systems reviewed and are negative.   Blood pressure 129/76, pulse 94, temperature 98.6 F (37 C), temperature source Oral, resp. rate 18, SpO2 96 %.There is no weight on file to calculate BMI.  General Appearance: Casual  Eye Contact:  Good  Speech:  Clear and Coherent and Normal Rate  Volume:  Normal  Mood:  Anxious  Affect:  Appropriate  Thought Process:  Coherent and Goal Directed  Orientation:  Full (Time, Place, and Person)  Thought Content:  Denies hallucinations, delusions, and paranoia  Suicidal Thoughts:  No  Homicidal Thoughts:  No  Memory:  Immediate;   Fair Recent;   Fair Remote;   Fair  Judgement:  Intact  Insight:  Good and Present  Psychomotor Activity:  Normal  Concentration:  Concentration: Fair and Attention Span: Fair  Recall:  [employer] of Knowledge:  Fair  Language:  Good  Akathisia:  No  Handed:  Right  AIMS (if indicated):     Assets:  Communication Skills Desire for Improvement Housing Social Support Transportation  ADL's:  Intact  Cognition:  WNL  Sleep:        Treatment Plan Summary: Plan Discharge home to follow up with Dr Casimiro Needle  Disposition: No evidence of imminent risk to self or others at present.   Patient does not meet criteria for psychiatric inpatient admission.  Earleen Newport, NP 04/27/2016 1:39 PM Patient seen and I agree with treatment and plan  Griffin Dakin.D.

## 2016-04-27 NOTE — Progress Notes (Signed)
CSW received collateral from this patient's husband.  Patient has family history of mental illness, but her first hospitalizations happened in 2015, she was hospitalized in 110 different hospitals and "was manic like."  The husband states the last 9 months she has been depressed, shut in, not wanting to do much.  She has a psychiatrist in the community and she told him on Tuesday she wanted to change her medication because she felt she was sleeping too much in the day.  The husband states he had no problem with it as they are both older and retired.  The psychiatrist changed her medication from Ativan to Clorazepate.  She had trouble sleeping for two nights and on Thursday she started cleaning cabinets which she had not done in 9 months.  However, Friday she had symptoms of a panic attack and wanted to go to the hospital.  [place] Disposition CSW [phone]

## 2016-04-27 NOTE — ED Notes (Signed)
PER BEHAVIORAL HEALTH, THE HUSBAND WILL BE HERE TO TAKE THE PT HOME IN THE MORNING. PT AND CHARGE NURSE MADE AWARE.

## 2016-04-27 NOTE — [facility] Suicide Risk Assessment (Signed)
Suicide Risk Assessment  Discharge Assessment   [facility] Discharge Suicide Risk Assessment   Principal Problem: GAD (generalized anxiety disorder) Discharge Diagnoses:  Patient Active Problem List   Diagnosis Date Noted  . GAD (generalized anxiety disorder) [F41.1] 04/27/2016  . Panic attack [F41.0] 04/27/2016  . MDD (major depressive disorder), recurrent severe, without psychosis ([place]) [F33.2] 04/27/2016  . Abnormal EEG [R94.01] 08/31/2014  . Brief psychotic disorder [F23] 08/31/2014  . Essential hypertension [I10] 08/31/2014  . Hyperlipidemia [E78.5] 08/31/2014  . Graves disease [E05.00] 08/31/2014    Total Time spent with patient: 20 minutes  Musculoskeletal: Strength & Muscle Tone: within normal limits Gait & Station: normal Patient leans: N/A  Psychiatric Specialty Exam:   Blood pressure 129/58, pulse 96, temperature 98.6 F (37 C), temperature source Oral, resp. rate 18, SpO2 97 %.There is no weight on file to calculate BMI.   General Appearance: Casual  Eye Contact: Good  Speech: Clear and Coherent and Normal Rate  Volume: Normal  Mood: Anxious  Affect: Appropriate  Thought Process: Coherent and Goal Directed  Orientation: Full (Time, Place, and Person)  Thought Content: Denies hallucinations, delusions, and paranoia  Suicidal Thoughts: No  Homicidal Thoughts: No  Memory: Immediate; Fair Recent; Fair Remote; Fair  Judgement: Intact  Insight: Good and Present  Psychomotor Activity: Normal  Concentration: Concentration: Fair and Attention Span: Fair  Recall: [employer] of Knowledge: Fair  Language: Good  Akathisia: No  Handed: Right  AIMS (if indicated):    Assets: Communication Skills Desire for Improvement Housing Social Support Transportation  ADL's: Intact  Cognition: WNL  Sleep:           Mental Status Per Nursing Assessment::   On Admission:     Demographic Factors:  Caucasian and  Female  Loss Factors: Decline in physical health  Historical Factors: Impulsivity  Risk Reduction Factors:   Living with another person, especially a relative, Positive social support and Positive therapeutic relationship  Continued Clinical Symptoms:  Previous Psychiatric Diagnoses and Treatments  Cognitive Features That Contribute To Risk:  Closed-mindedness    Suicide Risk:  Minimal: No identifiable suicidal ideation.  Patients presenting with no risk factors but with morbid ruminations; may be classified as minimal risk based on the severity of the depressive symptoms    Plan Of Care/Follow-up recommendations:  Activity:  As tolerated Diet:  Low sodium  Rankin, Shuvon, NP 04/27/2016, 1:58 PM   Patient seen and I agree with treatment and plan  Levonne Spiller M.D.

## 2016-04-28 NOTE — ED Notes (Signed)
Patient's husband presents to pick her up and take her home.  He is concerned that Ativan was prescribed as a discharge medication.  Patient's psychologist discontinued Ativan for patient last week.  Husband requests that Ativan rx not be given. Rx shredded.

## 2016-04-30 ENCOUNTER — Emergency Department (HOSPITAL_COMMUNITY): Payer: Commercial Managed Care - HMO

## 2016-04-30 ENCOUNTER — Observation Stay (HOSPITAL_COMMUNITY)
Admission: EM | Admit: 2016-04-30 | Discharge: 2016-05-04 | Payer: Commercial Managed Care - HMO | Attending: Internal Medicine | Admitting: Internal Medicine

## 2016-04-30 ENCOUNTER — Encounter (HOSPITAL_COMMUNITY): Payer: Self-pay | Admitting: Emergency Medicine

## 2016-04-30 DIAGNOSIS — F0634 Mood disorder due to known physiological condition with mixed features: Secondary | ICD-10-CM | POA: Diagnosis not present

## 2016-04-30 DIAGNOSIS — E876 Hypokalemia: Secondary | ICD-10-CM | POA: Insufficient documentation

## 2016-04-30 DIAGNOSIS — F411 Generalized anxiety disorder: Secondary | ICD-10-CM | POA: Diagnosis not present

## 2016-04-30 DIAGNOSIS — Z96651 Presence of right artificial knee joint: Secondary | ICD-10-CM | POA: Insufficient documentation

## 2016-04-30 DIAGNOSIS — Z7982 Long term (current) use of aspirin: Secondary | ICD-10-CM | POA: Diagnosis not present

## 2016-04-30 DIAGNOSIS — F410 Panic disorder [episodic paroxysmal anxiety] without agoraphobia: Secondary | ICD-10-CM | POA: Insufficient documentation

## 2016-04-30 DIAGNOSIS — D72829 Elevated white blood cell count, unspecified: Secondary | ICD-10-CM | POA: Insufficient documentation

## 2016-04-30 DIAGNOSIS — B952 Enterococcus as the cause of diseases classified elsewhere: Secondary | ICD-10-CM | POA: Insufficient documentation

## 2016-04-30 DIAGNOSIS — I4510 Unspecified right bundle-branch block: Secondary | ICD-10-CM | POA: Diagnosis not present

## 2016-04-30 DIAGNOSIS — R509 Fever, unspecified: Secondary | ICD-10-CM | POA: Diagnosis not present

## 2016-04-30 DIAGNOSIS — A419 Sepsis, unspecified organism: Principal | ICD-10-CM | POA: Diagnosis present

## 2016-04-30 DIAGNOSIS — E785 Hyperlipidemia, unspecified: Secondary | ICD-10-CM | POA: Diagnosis not present

## 2016-04-30 DIAGNOSIS — E0500 Thyrotoxicosis with diffuse goiter without thyrotoxic crisis or storm: Secondary | ICD-10-CM | POA: Diagnosis not present

## 2016-04-30 DIAGNOSIS — D473 Essential (hemorrhagic) thrombocythemia: Secondary | ICD-10-CM | POA: Insufficient documentation

## 2016-04-30 DIAGNOSIS — R6510 Systemic inflammatory response syndrome (SIRS) of non-infectious origin without acute organ dysfunction: Secondary | ICD-10-CM

## 2016-04-30 DIAGNOSIS — R000 Tachycardia, unspecified: Secondary | ICD-10-CM

## 2016-04-30 DIAGNOSIS — R079 Chest pain, unspecified: Secondary | ICD-10-CM | POA: Diagnosis not present

## 2016-04-30 DIAGNOSIS — R0600 Dyspnea, unspecified: Secondary | ICD-10-CM | POA: Insufficient documentation

## 2016-04-30 DIAGNOSIS — F332 Major depressive disorder, recurrent severe without psychotic features: Secondary | ICD-10-CM | POA: Insufficient documentation

## 2016-04-30 DIAGNOSIS — Z79899 Other long term (current) drug therapy: Secondary | ICD-10-CM | POA: Insufficient documentation

## 2016-04-30 DIAGNOSIS — E871 Hypo-osmolality and hyponatremia: Secondary | ICD-10-CM

## 2016-04-30 DIAGNOSIS — E872 Acidosis, unspecified: Secondary | ICD-10-CM

## 2016-04-30 DIAGNOSIS — D7589 Other specified diseases of blood and blood-forming organs: Secondary | ICD-10-CM | POA: Diagnosis not present

## 2016-04-30 DIAGNOSIS — F0390 Unspecified dementia without behavioral disturbance: Secondary | ICD-10-CM | POA: Diagnosis not present

## 2016-04-30 DIAGNOSIS — Z885 Allergy status to narcotic agent status: Secondary | ICD-10-CM | POA: Insufficient documentation

## 2016-04-30 DIAGNOSIS — Z0000 Encounter for general adult medical examination without abnormal findings: Secondary | ICD-10-CM | POA: Diagnosis not present

## 2016-04-30 DIAGNOSIS — N390 Urinary tract infection, site not specified: Secondary | ICD-10-CM | POA: Diagnosis not present

## 2016-04-30 DIAGNOSIS — M1990 Unspecified osteoarthritis, unspecified site: Secondary | ICD-10-CM | POA: Diagnosis not present

## 2016-04-30 DIAGNOSIS — I10 Essential (primary) hypertension: Secondary | ICD-10-CM | POA: Diagnosis not present

## 2016-04-30 DIAGNOSIS — F23 Brief psychotic disorder: Secondary | ICD-10-CM | POA: Diagnosis not present

## 2016-04-30 DIAGNOSIS — F333 Major depressive disorder, recurrent, severe with psychotic symptoms: Secondary | ICD-10-CM | POA: Diagnosis present

## 2016-04-30 DIAGNOSIS — D75839 Thrombocytosis, unspecified: Secondary | ICD-10-CM

## 2016-04-30 LAB — I-STAT CHEM 8, ED
BUN: 25 mg/dL — ABNORMAL HIGH (ref 6–20)
CHLORIDE: 91 mmol/L — AB (ref 101–111)
Calcium, Ion: 1.14 mmol/L (ref 1.12–1.23)
Creatinine, Ser: 0.6 mg/dL (ref 0.44–1.00)
Glucose, Bld: 179 mg/dL — ABNORMAL HIGH (ref 65–99)
HEMATOCRIT: 43 % (ref 36.0–46.0)
HEMOGLOBIN: 14.6 g/dL (ref 12.0–15.0)
POTASSIUM: 4.2 mmol/L (ref 3.5–5.1)
SODIUM: 128 mmol/L — AB (ref 135–145)
TCO2: 28 mmol/L (ref 0–100)

## 2016-04-30 LAB — URINALYSIS, ROUTINE W REFLEX MICROSCOPIC
BILIRUBIN URINE: NEGATIVE
GLUCOSE, UA: NEGATIVE mg/dL
HGB URINE DIPSTICK: NEGATIVE
Ketones, ur: NEGATIVE mg/dL
Leukocytes, UA: NEGATIVE
Nitrite: NEGATIVE
PH: 6.5 (ref 5.0–8.0)
Protein, ur: 30 mg/dL — AB
SPECIFIC GRAVITY, URINE: 1.012 (ref 1.005–1.030)

## 2016-04-30 LAB — COMPREHENSIVE METABOLIC PANEL
ALK PHOS: 111 U/L (ref 38–126)
ALT: 19 U/L (ref 14–54)
AST: 20 U/L (ref 15–41)
Albumin: 4.1 g/dL (ref 3.5–5.0)
Anion gap: 13 (ref 5–15)
BILIRUBIN TOTAL: 0.4 mg/dL (ref 0.3–1.2)
BUN: 19 mg/dL (ref 6–20)
CHLORIDE: 91 mmol/L — AB (ref 101–111)
CO2: 24 mmol/L (ref 22–32)
Calcium: 9.7 mg/dL (ref 8.9–10.3)
Creatinine, Ser: 0.7 mg/dL (ref 0.44–1.00)
GFR calc Af Amer: >60 mL/min (ref >60)
Glucose, Bld: 150 mg/dL — ABNORMAL HIGH (ref 65–99)
Potassium: 3.2 mmol/L — ABNORMAL LOW (ref 3.5–5.1)
Sodium: 128 mmol/L — ABNORMAL LOW (ref 135–145)
Total Protein: 7.8 g/dL (ref 6.5–8.1)

## 2016-04-30 LAB — TSH: TSH: 1.178 u[IU]/mL (ref 0.350–4.500)

## 2016-04-30 LAB — CBC
HCT: 38.4 % (ref 36.0–46.0)
Hemoglobin: 13.9 g/dL (ref 12.0–15.0)
MCH: 30.8 pg (ref 26.0–34.0)
MCHC: 36.2 g/dL — ABNORMAL HIGH (ref 30.0–36.0)
MCV: 85.1 fL (ref 78.0–100.0)
PLATELETS: 514 10*3/uL — AB (ref 150–400)
RBC: 4.51 MIL/uL (ref 3.87–5.11)
RDW: 13.1 % (ref 11.5–15.5)
WBC: 10.9 10*3/uL — AB (ref 4.0–10.5)

## 2016-04-30 LAB — URINE MICROSCOPIC-ADD ON
BACTERIA UA: NONE SEEN
RBC / HPF: NONE SEEN RBC/hpf (ref 0–5)

## 2016-04-30 LAB — I-STAT TROPONIN, ED
TROPONIN I, POC: 0.01 ng/mL (ref 0.00–0.08)
Troponin i, poc: 0.03 ng/mL (ref 0.00–0.08)

## 2016-04-30 LAB — RAPID URINE DRUG SCREEN, HOSP PERFORMED
AMPHETAMINES: NOT DETECTED
BENZODIAZEPINES: POSITIVE — AB
Barbiturates: NOT DETECTED
COCAINE: NOT DETECTED
OPIATES: NOT DETECTED
Tetrahydrocannabinol: NOT DETECTED

## 2016-04-30 LAB — ETHANOL

## 2016-04-30 LAB — SALICYLATE LEVEL: Salicylate Lvl: <4 mg/dL (ref 2.8–30.0)

## 2016-04-30 LAB — ACETAMINOPHEN LEVEL

## 2016-04-30 MED ORDER — POTASSIUM CHLORIDE CRYS ER 20 MEQ PO TBCR
20.0000 meq | EXTENDED_RELEASE_TABLET | Freq: Every day | ORAL | Status: DC
  Administered 2016-05-01 – 2016-05-03 (×3): 20 meq via ORAL
  Filled 2016-04-30 (×3): qty 1

## 2016-04-30 MED ORDER — SERTRALINE HCL 100 MG PO TABS
100.0000 mg | ORAL_TABLET | Freq: Every day | ORAL | Status: DC
  Administered 2016-04-30 – 2016-05-03 (×4): 100 mg via ORAL
  Filled 2016-04-30: qty 1
  Filled 2016-04-30: qty 2
  Filled 2016-04-30: qty 1
  Filled 2016-04-30: qty 2

## 2016-04-30 MED ORDER — HYDROCHLOROTHIAZIDE 25 MG PO TABS
25.0000 mg | ORAL_TABLET | Freq: Every day | ORAL | Status: DC
  Administered 2016-05-01 – 2016-05-03 (×3): 25 mg via ORAL
  Filled 2016-04-30 (×3): qty 1

## 2016-04-30 MED ORDER — AMLODIPINE BESYLATE 5 MG PO TABS
2.5000 mg | ORAL_TABLET | Freq: Every day | ORAL | Status: DC
  Administered 2016-05-01 – 2016-05-03 (×3): 2.5 mg via ORAL
  Filled 2016-04-30 (×3): qty 1

## 2016-04-30 MED ORDER — LOSARTAN POTASSIUM 50 MG PO TABS
100.0000 mg | ORAL_TABLET | Freq: Every day | ORAL | Status: DC
  Administered 2016-05-01 – 2016-05-03 (×3): 100 mg via ORAL
  Filled 2016-04-30 (×3): qty 2

## 2016-04-30 MED ORDER — CLORAZEPATE DIPOTASSIUM 3.75 MG PO TABS
3.7500 mg | ORAL_TABLET | Freq: Two times a day (BID) | ORAL | Status: DC
  Administered 2016-04-30 – 2016-05-01 (×3): 3.75 mg via ORAL
  Filled 2016-04-30 (×3): qty 1

## 2016-04-30 MED ORDER — SIMVASTATIN 20 MG PO TABS
20.0000 mg | ORAL_TABLET | Freq: Every evening | ORAL | Status: DC
  Administered 2016-04-30 – 2016-05-03 (×4): 20 mg via ORAL
  Filled 2016-04-30 (×5): qty 1

## 2016-04-30 MED ORDER — SODIUM CHLORIDE 0.9 % IV BOLUS (SEPSIS)
500.0000 mL | Freq: Once | INTRAVENOUS | Status: AC
  Administered 2016-04-30: 500 mL via INTRAVENOUS

## 2016-04-30 MED ORDER — ASPIRIN 81 MG PO CHEW
81.0000 mg | CHEWABLE_TABLET | Freq: Every day | ORAL | Status: DC
  Administered 2016-05-01 – 2016-05-03 (×3): 81 mg via ORAL
  Filled 2016-04-30 (×3): qty 1

## 2016-04-30 MED ORDER — POTASSIUM CHLORIDE CRYS ER 20 MEQ PO TBCR
40.0000 meq | EXTENDED_RELEASE_TABLET | Freq: Once | ORAL | Status: AC
  Administered 2016-04-30: 40 meq via ORAL
  Filled 2016-04-30: qty 2

## 2016-04-30 MED ORDER — LOSARTAN POTASSIUM-HCTZ 100-25 MG PO TABS
1.0000 | ORAL_TABLET | Freq: Every day | ORAL | Status: DC
Start: 2016-04-30 — End: 2016-04-30

## 2016-04-30 NOTE — ED Provider Notes (Addendum)
CSN: [id number]     Arrival date & time 04/30/16  1204 History   First MD Initiated Contact with Patient 04/30/16 1256     Chief Complaint  Patient presents with  . Medical Clearance      The history is provided by the patient.  Level 5 caveat due to psychiatric disorder.  Patient presents with altered mental status. Seen a few days ago and diagnosed with brief psychotic disorder. She has been doing much worse recently. I discussed with her psychiatrist Dr. Casimiro Needle. [phone]) he states he has seen her and this is far from her baseline. States that she will need inpatient treatment. States he's discussed with [facility] and they will hopefully have available but this afternoon. States psychiatry here does not need to see her. He said he can be contacted with any questions. Patient is reportedly been doing much worse per her daughter.Has been eating and drinking less. She is a risk at home. Past Medical History  Diagnosis Date  . Hyperlipidemia   . Hypertension   . Anxiety   . Arthritis   . Graves disease    Past Surgical History  Procedure Laterality Date  . Total knee arthroplasty  2011    right   Family History  Problem Relation Age of Onset  . Colon cancer Neg Hx   . Stomach cancer Neg Hx    Social History  Substance Use Topics  . Smoking status: Never Smoker   . Smokeless tobacco: Never Used  . Alcohol Use: No   OB History    No data available     Review of Systems  Unable to perform ROS: Psychiatric disorder      Allergies  Codeine  Home Medications   Prior to Admission medications   Medication Sig Start Date End Date Taking? Authorizing Provider  amLODipine (NORVASC) 2.5 MG tablet Take 2.5 mg by mouth daily.    Historical Provider, MD  aspirin 81 MG tablet Take 81 mg by mouth daily.    Historical Provider, MD  clorazepate (TRANXENE) 3.75 MG tablet Take 3.75 mg by mouth 2 (two) times daily. 04/23/16   Historical Provider, MD  ergocalciferol (VITAMIN  D2) 50000 UNITS capsule Take 50,000 Units by mouth once a week. mondays    Historical Provider, MD  LORazepam (ATIVAN) 1 MG tablet Take 1 tablet (1 mg total) by mouth 2 (two) times daily as needed for anxiety (agitation). 04/27/16   Shuvon B Rankin, NP  losartan-hydrochlorothiazide (HYZAAR) 100-25 MG tablet Take 1 tablet by mouth daily.    Historical Provider, MD  potassium chloride SA (K-DUR,KLOR-CON) 20 MEQ tablet Take 1 tablet (20 mEq total) by mouth daily. 04/27/16   Shuvon B Rankin, NP  sertraline (ZOLOFT) 100 MG tablet 100 mg. Take 1 tablet daily 08/31/15   Historical Provider, MD  simvastatin (ZOCOR) 20 MG tablet Take 20 mg by mouth every evening. Take 1 tablet daily 11/03/14   Historical Provider, MD   BP 130/117 mmHg  Pulse 104  Temp(Src) 97.7 F (36.5 C) (Axillary)  Resp 17  Ht 5\' 6"  (1.676 m)  Wt 178 lb (80.74 kg)  BMI 28.74 kg/m2  SpO2 98% Physical Exam  Constitutional: She appears well-developed.  HENT:  Head: Atraumatic.  Eyes: EOM are normal.  Neck: Neck supple.  Cardiovascular: Normal rate.   Abdominal: Soft.  Musculoskeletal: Normal range of motion.  Neurological: She is alert.  Patient is awake but not very oriented. Will move all extremities.  Skin: Skin is warm.  ED Course  Procedures (including critical care time) Labs Review Labs Reviewed  COMPREHENSIVE METABOLIC PANEL - Abnormal; Notable for the following:    Sodium 128 (*)    Potassium 3.2 (*)    Chloride 91 (*)    Glucose, Bld 150 (*)    All other components within normal limits  ACETAMINOPHEN LEVEL - Abnormal; Notable for the following:    Acetaminophen (Tylenol), Serum <10 (*)    All other components within normal limits  CBC - Abnormal; Notable for the following:    WBC 10.9 (*)    MCHC 36.2 (*)    Platelets 514 (*)    All other components within normal limits  URINE RAPID DRUG SCREEN, HOSP PERFORMED - Abnormal; Notable for the following:    Benzodiazepines POSITIVE (*)    All other  components within normal limits  URINALYSIS, ROUTINE W REFLEX MICROSCOPIC (NOT AT [facility]) - Abnormal; Notable for the following:    Protein, ur 30 (*)    All other components within normal limits  URINE MICROSCOPIC-ADD ON - Abnormal; Notable for the following:    Squamous Epithelial / LPF 0-5 (*)    Casts HYALINE CASTS (*)    All other components within normal limits  ETHANOL  SALICYLATE LEVEL    Imaging Review Dg Chest 2 View  04/30/2016  CLINICAL DATA:  Medical clearance for behavioral health. History of hypertension EXAM: CHEST  2 VIEW COMPARISON:  07/02/2014 FINDINGS: Low lung volumes. Heart and mediastinal contours are within normal limits. No focal opacities or effusions. No acute bony abnormality. IMPRESSION: No active cardiopulmonary disease. Electronically Signed   By: Rolm Baptise M.D.   On: 04/30/2016 13:36   I have personally reviewed and evaluated these images and lab results as part of my medical decision-making.   EKG Interpretation None      MDM   Final diagnoses:  MDD (major depressive disorder), recurrent severe, without psychosis ([place])  Hyponatremia  Hypokalemia    Patient sent in for medical clearance and possible placement at [facility]. Sent in by her psychiatrist Dr. Casimiro Needle. Lab work shows hyponatremia and hypokalemia, however for improved from a few days ago. Urinalysis pending at this time. Discussed with the TTS team here. States she'll still need evaluation by them for placement despite what the outpatient psychiatrist stated. Urinalysis still pending for medical clearance.    Davonna Belling, MD 04/30/16 1604  Urinalysis returned did not show infection. Mild hyponatremia and hypokalemia improved from a few days ago. May benefit from a recheck again tomorrow. To be seen by TTS.   Davonna Belling, MD 04/30/16 [place], MD 04/30/16 [phone]

## 2016-04-30 NOTE — Progress Notes (Signed)
ED CM visited pt and daughter in Triage #5 Pt was nonverbal initially but then began to speak when ED nursing staff began to assist her to Restroom to attempt to get UA to complete medical clearance Daughter Gregary Signs states her step dad, the pt's spouse is in the lobby and has a tendency of not turning on his cell phone only to make a telephone call States she is trying to give step dad rest related to having been up with pt most of the night Confirms pt has expressed to her being paranoid and thinking staff in hallway are talking about her Cm offered comfort measure to pt to inform her that there is a lot of echos in the hall way but staff was not speaking of her Pt nodded her head in understanding  Jonelle Sidle provided her cell number to be reach if pt or spouse not responding Entered in EPIC demographics as [phone]  CM discussed the medical clearance process with pt and daughter and encouraged UA to complete process ED RN running IVFs Jonelle Sidle states Dr Casimiro Needle has spoke with [facility] about referral for placement for pt pending medical clearance  Pt did speak prior to CM leaving the room to discuss which of her knees caused her pain with ambulating

## 2016-04-30 NOTE — ED Notes (Signed)
It is difficult to assess pt at this time. She is hyperfocused on washing her hands. Will attempt to assess her in a few min

## 2016-04-30 NOTE — ED Notes (Signed)
Pt being sent by Dr. Tally Joe, who requests to be called to conference about patient treatment at [phone]. Per Dr. Alyse Low, pt is acutely psychotic, was seen in ED and released on Monday, is too psychotic for her husband to care for her. Dr. Alyse Low states pt has bed at [facility] (as long as another patient does not take bed first) but that patient only needs to be medically cleared. Dr. Alyse Low states that patient does not need psychiatric eval in ED, states he is able to provide this evaluation, pt only needs medical clearance. Please contact Dr. Alyse Low at above number.

## 2016-04-30 NOTE — ED Notes (Signed)
Contacted pharmacy about medications. States they need to get them verified.

## 2016-04-30 NOTE — ED Notes (Signed)
Patient was seen here a few days ago. Patient's daughter states patient has gotten worse since. Patient's daughters states she has a bed at [facility]. Patient just needs to be medically cleared.

## 2016-04-30 NOTE — ED Notes (Signed)
Patient complaining of chest pain and shortness of breath. Oleta Mouse, MD made aware.

## 2016-04-30 NOTE — BH Assessment (Addendum)
Assessment Note  Heather Wilson is an 70 y.o. female referred to [facility] by her psychiatrist (Dr. Marchelle Gearing). Patient with history of Anxiety. Pt was not able to answer any of the  assessment questions so most of the information was recorded by her daughter who was present at bedside. Daughter sts that 1-2 weeks ago patient started "spending more money than usual, not sleeping, high anxiety and couldn't be still".  She states that a lot of these behaviors started after Dr. Marchelle Gearing changed her medications. Sts that patient also had trouble remembering and at times seemed disoriented. Patient was brought to [facility] for medical clearance and pending [place] placement. Patient was referred out to various [place] facilities but not placed due to lack of bed availability in the community. Meanwhile patient was evaluated by psychiatry as she was awaiting bed placement. She eventually stabilized in the ED and was discharged home (2 days ago).   According to the daughter patient was initially ok and appeared herself. However, patient quickly started to exhibit bizarre, paranoid, and OCD behaviors. Patient would check for camera's in the home, follow her spouse around the house, accuse people of talking about her, and speak in past tense. As of today patient stopped talking and responding. Patient also will not do anything for herself including dressing or grooming. Writer tried to ask patient several questions but she just look away and smiled. Daughter states that pt has had an "episode" like this before 2 years ago where she was hospitalized due to a manic episode.   Writer is unable to confirm or deny if patient is having suicidal thoughts and homicidal thoughts. Also, unable to determine if patient is experiencing AVH's. Unable to determine if patient has a history of substance use. Patient's daughter denies that patient has a history of substance use. No history of trauma or abuse.  Patient is overall calm  and cooperative.   Diagnosis: Bipolar I Disorder, Unspecified  Past Medical History:  Past Medical History  Diagnosis Date  . Hyperlipidemia   . Hypertension   . Anxiety   . Arthritis   . Graves disease     Past Surgical History  Procedure Laterality Date  . Total knee arthroplasty  2011    right    Family History:  Family History  Problem Relation Age of Onset  . Colon cancer Neg Hx   . Stomach cancer Neg Hx     General Assessment Data Location of Assessment: [facility] ED TTS Assessment: In system Is this a Tele or Face-to-Face Assessment?: Face-to-Face Is this an Initial Assessment or a Re-assessment for this encounter?: Initial Assessment Marital status: Married Is patient pregnant?: No Pregnancy Status: No Living Arrangements: Spouse/significant other Can pt return to current living arrangement?: Yes Admission Status: Voluntary Is patient capable of signing voluntary admission?: Yes Referral Source: Self/Family/Friend Insurance type:  Passenger transport manager )     Crisis Care Plan Living Arrangements: Spouse/significant other Legal Guardian:  (no guardian ) Name of Psychiatrist:  (Dr. Fulton Reek) Name of Therapist:  (no therapist )  Education Status Is patient currently in school?: No Current Grade:  (n/a) Highest grade of school patient has completed: [employer] Name of school:  (n/a) Contact person:  (n/a)  Risk to self with the past 6 months Suicidal Ideation:  (patient unable to confirm or deny) Has patient been a risk to self within the past 6 months prior to admission? :  (unk) Suicidal Intent:  (unk) Has patient had any suicidal intent  within the past 6 months prior to admission? :  (unk) Is patient at risk for suicide?:  (unk) Suicidal Plan?:  (unk) Has patient had any suicidal plan within the past 6 months prior to admission? :  (unk) Access to Means:  (unk) What has been your use of drugs/alcohol within the last 12 months?:  (no substance use) Previous  Attempts/Gestures:  (unk) How many times?:  (0) Other Self Harm Risks:  (None ) Triggers for Past Attempts: None known Intentional Self Injurious Behavior: None (per daughter) Family Suicide History: Yes (Father-Bipolar Disorder) Recent stressful life event(s): Other (Comment) (medication change) Persecutory voices/beliefs?:  (unk) Depression:  (unk) Depression Symptoms:  (unk) Substance abuse history and/or treatment for substance abuse?:  (unk) Suicide prevention information given to non-admitted patients:  (unk)  Risk to Others within the past 6 months Homicidal Ideation:  (unable to confirm or deny) Does patient have any lifetime risk of violence toward others beyond the six months prior to admission? : Unknown Thoughts of Harm to Others:  (unk to confirm or deny) Current Homicidal Intent:  (unk) Current Homicidal Plan:  (unk) Access to Homicidal Means:  (unk) Identified Victim:  (n/a) History of harm to others?:  (unk) Assessment of Violence:  (unk) Violent Behavior Description:  (patient is calm and cooperative ) Does patient have access to weapons?:  (unk) Criminal Charges Pending?:  (unk) Does patient have a court date:  (unk) Is patient on probation?: Unknown  Psychosis Hallucinations:  (unk) Delusions:  (unk)  Mental Status Report Appearance/Hygiene: Disheveled Eye Contact: Fair Motor Activity: Restlessness Speech: Unable to assess Level of Consciousness: Alert (alert but not talking) Mood: Suspicious, Preoccupied, Empty Affect: Blunted Anxiety Level: Panic Attacks Panic attack frequency:  (unk) Most recent panic attack:  (this am) Thought Processes: Thought Blocking Judgement: Impaired Orientation: Unable to assess, Not oriented Obsessive Compulsive Thoughts/Behaviors: Severe  Cognitive Functioning Concentration: Decreased Memory: Unable to Assess IQ: Average Insight: Poor Impulse Control: Poor Appetite: Poor Weight Loss:  (0) Weight Gain:   (0) Sleep: Decreased Total Hours of Sleep:  (unk) Vegetative Symptoms: Staying in bed  ADLScreening [facility] Assessment Services) Patient's cognitive ability adequate to safely complete daily activities?: Yes Patient able to express need for assistance with ADLs?: Yes Independently performs ADLs?: No  Prior Inpatient Therapy Prior Inpatient Therapy: Yes Prior Therapy Dates: 2015 Prior Therapy Facilty/Provider([facility] Reason for Treatment: bipolar manic episode  Prior Outpatient Therapy Prior Outpatient Therapy: Yes Prior Therapy Dates: ongoing Prior Therapy Facilty/Provider(s): Dr. Leonidas Romberg Reason for Treatment: Medication management Does patient have an ACCT team?: No Does patient have Intensive In-House Services?  : No Does patient have [facility] services? : No Does patient have P4CC services?: No  ADL Screening (condition at time of admission) Patient's cognitive ability adequate to safely complete daily activities?: Yes Is the patient deaf or have difficulty hearing?: No Does the patient have difficulty seeing, even when wearing glasses/contacts?: No Does the patient have difficulty concentrating, remembering, or making decisions?: No Patient able to express need for assistance with ADLs?: Yes Does the patient have difficulty dressing or bathing?: No Independently performs ADLs?: No Communication: Independent Dressing (OT): Needs assistance Is this a change from baseline?: Change from baseline, expected to last <3days Grooming: Independent Feeding: Independent Bathing: Needs assistance Is this a change from baseline?: Change from baseline, expected to last <3 days Toileting: Needs assistance Is this a change from baseline?: Change from baseline, expected to last <3 days In/Out Bed: Needs assistance Is this a change  from baseline?: Change from baseline, expected to last <3 days Walks in Home: Needs assistance Is this a change from baseline?: Change from baseline,  expected to last <3 days Does the patient have difficulty walking or climbing stairs?: No Weakness of Legs: None Weakness of Arms/Hands: None  Home Assistive Devices/Equipment Home Assistive Devices/Equipment: None    Abuse/Neglect Assessment (Assessment to be complete while patient is alone) Physical Abuse: Denies Verbal Abuse: Denies Sexual Abuse: Denies Exploitation of patient/patient's resources: Denies Self-Neglect: Denies Possible abuse reported to:: [place] [facility] Does patient have an advance directive?: No Would patient like information on creating an advanced directive?: No - patient declined information Nutrition Screen- [facility] Patient's home diet: Regular  Additional Information 1:1 In Past 12 Months?: No CIRT Risk: No Elopement Risk: No Does patient have medical clearance?: Yes     Disposition:  Disposition Initial Assessment Completed for this Encounter: Yes Disposition of Patient: Inpatient treatment program Priscille Loveless, NP recommended INPT admisson [facility])) Type of inpatient treatment program: Adult  On Site Evaluation by:   Reviewed with Physician:  Priscille Loveless, NP   [facility] 04/30/2016 5:28 PM

## 2016-05-01 ENCOUNTER — Emergency Department (HOSPITAL_COMMUNITY): Payer: Commercial Managed Care - HMO

## 2016-05-01 DIAGNOSIS — R509 Fever, unspecified: Secondary | ICD-10-CM | POA: Diagnosis not present

## 2016-05-01 DIAGNOSIS — A419 Sepsis, unspecified organism: Secondary | ICD-10-CM | POA: Diagnosis present

## 2016-05-01 LAB — BASIC METABOLIC PANEL
ANION GAP: 11 (ref 5–15)
BUN: 17 mg/dL (ref 6–20)
CO2: 23 mmol/L (ref 22–32)
Calcium: 9.2 mg/dL (ref 8.9–10.3)
Chloride: 97 mmol/L — ABNORMAL LOW (ref 101–111)
Creatinine, Ser: 0.74 mg/dL (ref 0.44–1.00)
GFR calc Af Amer: >60 mL/min (ref >60)
GFR calc non Af Amer: >60 mL/min (ref >60)
GLUCOSE: 130 mg/dL — AB (ref 65–99)
POTASSIUM: 3.7 mmol/L (ref 3.5–5.1)
Sodium: 131 mmol/L — ABNORMAL LOW (ref 135–145)

## 2016-05-01 LAB — CBC
HCT: 29.9 % — ABNORMAL LOW (ref 36.0–46.0)
HEMOGLOBIN: 10.4 g/dL — AB (ref 12.0–15.0)
MCH: 30.9 pg (ref 26.0–34.0)
MCHC: 34.8 g/dL (ref 30.0–36.0)
MCV: 88.7 fL (ref 78.0–100.0)
Platelets: 405 10*3/uL — ABNORMAL HIGH (ref 150–400)
RBC: 3.37 MIL/uL — ABNORMAL LOW (ref 3.87–5.11)
RDW: 13.6 % (ref 11.5–15.5)
WBC: 9.2 10*3/uL (ref 4.0–10.5)

## 2016-05-01 LAB — URINALYSIS, ROUTINE W REFLEX MICROSCOPIC
Bilirubin Urine: NEGATIVE
GLUCOSE, UA: NEGATIVE mg/dL
Hgb urine dipstick: NEGATIVE
Ketones, ur: NEGATIVE mg/dL
LEUKOCYTES UA: NEGATIVE
Nitrite: NEGATIVE
PH: 6.5 (ref 5.0–8.0)
Protein, ur: 30 mg/dL — AB
Specific Gravity, Urine: 1.012 (ref 1.005–1.030)

## 2016-05-01 LAB — CBC WITH DIFFERENTIAL/PLATELET
BASOS ABS: 0 10*3/uL (ref 0.0–0.1)
BASOS PCT: 0 %
EOS ABS: 0 10*3/uL (ref 0.0–0.7)
Eosinophils Relative: 0 %
HEMATOCRIT: 41.2 % (ref 36.0–46.0)
HEMOGLOBIN: 14.3 g/dL (ref 12.0–15.0)
Lymphocytes Relative: 13 %
Lymphs Abs: 1.5 10*3/uL (ref 0.7–4.0)
MCH: 30.4 pg (ref 26.0–34.0)
MCHC: 34.7 g/dL (ref 30.0–36.0)
MCV: 87.7 fL (ref 78.0–100.0)
MONO ABS: 1.3 10*3/uL — AB (ref 0.1–1.0)
Monocytes Relative: 12 %
NEUTROS ABS: 8.3 10*3/uL — AB (ref 1.7–7.7)
NEUTROS PCT: 75 %
Platelets: 547 10*3/uL — ABNORMAL HIGH (ref 150–400)
RBC: 4.7 MIL/uL (ref 3.87–5.11)
RDW: 13.4 % (ref 11.5–15.5)
WBC: 11.1 10*3/uL — ABNORMAL HIGH (ref 4.0–10.5)

## 2016-05-01 LAB — I-STAT CG4 LACTIC ACID, ED
LACTIC ACID, VENOUS: 4.12 mmol/L — AB (ref 0.5–1.9)
Lactic Acid, Venous: 0.98 mmol/L (ref 0.5–1.9)

## 2016-05-01 LAB — URINE MICROSCOPIC-ADD ON

## 2016-05-01 LAB — T4, FREE: Free T4: 1.36 ng/dL — ABNORMAL HIGH (ref 0.61–1.12)

## 2016-05-01 LAB — I-STAT TROPONIN, ED: TROPONIN I, POC: 0.01 ng/mL (ref 0.00–0.08)

## 2016-05-01 LAB — CREATININE, SERUM
CREATININE: 0.84 mg/dL (ref 0.44–1.00)
GFR calc Af Amer: >60 mL/min (ref >60)
GFR calc non Af Amer: >60 mL/min (ref >60)

## 2016-05-01 LAB — TSH: TSH: 2.276 u[IU]/mL (ref 0.350–4.500)

## 2016-05-01 MED ORDER — ACETAMINOPHEN 325 MG PO TABS: 650.0000 mg | ORAL_TABLET | Freq: Four times a day (QID) | ORAL | Status: DC | PRN

## 2016-05-01 MED ORDER — PIPERACILLIN-TAZOBACTAM 3.375 G IVPB
3.3750 g | Freq: Three times a day (TID) | INTRAVENOUS | Status: DC
  Administered 2016-05-01 – 2016-05-02 (×2): 3.375 g via INTRAVENOUS
  Filled 2016-05-01 (×2): qty 50

## 2016-05-01 MED ORDER — ACETAMINOPHEN 650 MG RE SUPP: 650.0000 mg | Freq: Four times a day (QID) | RECTAL | Status: DC | PRN

## 2016-05-01 MED ORDER — VANCOMYCIN HCL IN DEXTROSE 1-5 GM/200ML-% IV SOLN: 1000.0000 mg | Freq: Once | INTRAVENOUS | Status: DC

## 2016-05-01 MED ORDER — SODIUM CHLORIDE 0.9 % IV BOLUS (SEPSIS)
500.0000 mL | Freq: Once | INTRAVENOUS | Status: AC
  Administered 2016-05-01: 500 mL via INTRAVENOUS

## 2016-05-01 MED ORDER — VANCOMYCIN HCL IN DEXTROSE 750-5 MG/150ML-% IV SOLN
750.0000 mg | Freq: Two times a day (BID) | INTRAVENOUS | Status: DC
  Administered 2016-05-01: 750 mg via INTRAVENOUS
  Filled 2016-05-01 (×2): qty 150

## 2016-05-01 MED ORDER — PIPERACILLIN-TAZOBACTAM 3.375 G IVPB 30 MIN
3.3750 g | Freq: Once | INTRAVENOUS | Status: AC
  Administered 2016-05-01: 3.375 g via INTRAVENOUS
  Filled 2016-05-01: qty 50

## 2016-05-01 MED ORDER — ONDANSETRON HCL 4 MG/2ML IJ SOLN: 4.0000 mg | Freq: Four times a day (QID) | INTRAMUSCULAR | Status: DC | PRN

## 2016-05-01 MED ORDER — SODIUM CHLORIDE 0.9 % IV SOLN
INTRAVENOUS | Status: AC
  Administered 2016-05-01: 21:00:00 via INTRAVENOUS

## 2016-05-01 MED ORDER — LORAZEPAM 2 MG/ML IJ SOLN
1.0000 mg | Freq: Once | INTRAMUSCULAR | Status: AC
  Administered 2016-05-01: 1 mg via INTRAVENOUS
  Filled 2016-05-01: qty 1

## 2016-05-01 MED ORDER — HALOPERIDOL 2 MG PO TABS
2.0000 mg | ORAL_TABLET | Freq: Once | ORAL | Status: AC
  Administered 2016-05-01: 2 mg via ORAL
  Filled 2016-05-01: qty 1

## 2016-05-01 MED ORDER — ACETAMINOPHEN 325 MG PO TABS
650.0000 mg | ORAL_TABLET | Freq: Once | ORAL | Status: AC
  Administered 2016-05-01: 650 mg via ORAL
  Filled 2016-05-01: qty 2

## 2016-05-01 MED ORDER — LOSARTAN POTASSIUM-HCTZ 100-25 MG PO TABS: 1.0000 | ORAL_TABLET | Freq: Every day | ORAL | Status: DC

## 2016-05-01 MED ORDER — SODIUM CHLORIDE 0.9 % IV BOLUS (SEPSIS): 500.0000 mL | Freq: Once | INTRAVENOUS | Status: DC

## 2016-05-01 MED ORDER — SODIUM CHLORIDE 0.9 % IV SOLN
1500.0000 mg | INTRAVENOUS | Status: AC
  Administered 2016-05-01: 1500 mg via INTRAVENOUS
  Filled 2016-05-01: qty 1500

## 2016-05-01 MED ORDER — ONDANSETRON HCL 4 MG PO TABS: 4.0000 mg | ORAL_TABLET | Freq: Four times a day (QID) | ORAL | Status: DC | PRN

## 2016-05-01 MED ORDER — SENNOSIDES-DOCUSATE SODIUM 8.6-50 MG PO TABS: 1.0000 | ORAL_TABLET | Freq: Every evening | ORAL | Status: DC | PRN

## 2016-05-01 MED ORDER — SODIUM CHLORIDE 0.9 % IV BOLUS (SEPSIS)
2000.0000 mL | Freq: Once | INTRAVENOUS | Status: AC
  Administered 2016-05-01: 2000 mL via INTRAVENOUS

## 2016-05-01 MED ORDER — ENOXAPARIN SODIUM 40 MG/0.4ML ~~LOC~~ SOLN
40.0000 mg | SUBCUTANEOUS | Status: DC
  Administered 2016-05-01 – 2016-05-03 (×3): 40 mg via SUBCUTANEOUS
  Filled 2016-05-01 (×3): qty 0.4

## 2016-05-01 NOTE — ED Notes (Signed)
RN at bedside collecting labs 

## 2016-05-01 NOTE — ED Notes (Signed)
Abnormal lab MD Alfonse Spruce have been made aware

## 2016-05-01 NOTE — ED Provider Notes (Signed)
70 year old female who had been waiting for placement from psych. I was asked to evaluate her because she had been having ongoing tachycardia that started previous evening. This morning the patient's heart rate was noted to be in the 150s up to 160s. She was given some Haldol as she appeared to be agitated but this did not help. Her rectal temperature was 100.4. She was noted to have red mildly uptrending leukocytosis. For this reason I could sepsis was initiated. Her heart rate responded to fluids as well as appeared a dose of Ativan. She was started on broad-spectrum antibiotics. Unclear etiology of her low-grade temperature. Discussed case with [facility] hospitalist who agreed with admission and patient was admitted under their care with cultures pending.  Harvel Quale, MD 05/01/16 1728

## 2016-05-01 NOTE — ED Provider Notes (Addendum)
Please see previous physicians note regarding patient's presenting history and physical, initial ED course, and associated medical decision making.   She was sent to ED by her outpatient psychiatrist for inpatient psychiatric treatment. She has history of HTN, HLD and anxiety. Was admitted one year ago to [facility] for acute psychosis, paranoia, and manic behavior. At that time was seen initially at [facility] for medical clearance and had extensive workup including CT head, MRI, LP, EEG, and blood work that was overall unremarkable. Her daughter says that about 1-2 weeks ago patient began to exhibit similar behavior which led to her hospitalization for psychiatric treatment one year ago. This is been in the setting of her psychiatrist changing some of her psychiatric medications. She states that she has had increasing paranoia, thinking that cameras have been filming her. States that she is becoming suspicious of her and her husband. Has been complaining of her heart racing and feeling short of breath with increasing anxiety. She has not been eating well or sleeping well. She was seen earlier this week for anxiety and cleared by psychiatry for discharge. Symptoms have been worsening and was brought back today for evaluation after her psychiatrist that she required inpatient psychiatric evaluation. She had been felt to be medically cleared by previous provider for psychiatric evaluation.  I was asked to see her for complaints of dyspnea and chest tightness which her daughter states that is consistent with her anxiety attacks recently. Her CXR is clear. EKG w/o ischemia and serial troponins negative. With resolved symptoms. Seems atypical for ACS and no concern for PE, dissection at this time. Felt to be ruled out from cardiac standpoint at this time. She has had extensive w/u for similar symptoms one year ago for medical cause which was overall unremarkable and this seems to be exact same  presentation as before. At this time, pending inpatient psychiatric placement.  Forde Dandy, MD 05/01/16 [id number]  Forde Dandy, MD 05/01/16 [phone]

## 2016-05-01 NOTE — BH Assessment (Signed)
Pt referral has been submitted to [facility].

## 2016-05-01 NOTE — ED Notes (Signed)
MD at bedside to assess pt. Pt is anxious, playing with bed sheets. MD aware of pt's elevated HR.

## 2016-05-01 NOTE — ED Notes (Signed)
Pt can go to floor at 13:00.

## 2016-05-01 NOTE — ED Notes (Signed)
Pt not making sense with conversing with her. Pt pointing to things in the room and saying things such as "it's your family too" when attempting to give pt medication.

## 2016-05-01 NOTE — H&P (Signed)
History and Physical  Heather Wilson [id number] DOB: 09/01/1946 DOA: 04/30/2016   PCP: Shirline Frees, MD   Patient coming from: Home   Chief Complaint: Psychosis   HPI: Heather Wilson is a 70 y.o. female with medical history significant for mental illness including anxiety and prior psychiatric episode one year ago since when she has been under the care of psychiatry who presented to the ER with family from psych clinic 6/28 with recurrent psychosis. Intention was for inpatient psych admission, while awaiting bed she has become more tachycardic, developed a low grade fever and elevated lactate to over 4. [facility] was called to admit.  Currently pt is asleep after getting a dose of Ativan for agitation, history is taken from ER providers and the chart as there is no family present either.  Review of Systems: Please see HPI for pertinent positives and negatives. A complete ROS could not be performed due to patient condition.  Past Medical History  Diagnosis Date  . Hyperlipidemia   . Hypertension   . Anxiety   . Arthritis   . Graves disease    Past Surgical History  Procedure Laterality Date  . Total knee arthroplasty  2011    right    Social History:  reports that she has never smoked. She has never used smokeless tobacco. She reports that she does not drink alcohol or use illicit drugs.   Allergies  Allergen Reactions  . Codeine     Increased anxiety    Family History  Problem Relation Age of Onset  . Colon cancer Neg Hx   . Stomach cancer Neg Hx      Prior to Admission medications   Medication Sig Start Date End Date Taking? Authorizing Provider  amLODipine (NORVASC) 2.5 MG tablet Take 2.5 mg by mouth daily.   Yes Historical Provider, MD  aspirin 81 MG tablet Take 81 mg by mouth daily.   Yes Historical Provider, MD  clorazepate (TRANXENE) 3.75 MG tablet Take 3.75 mg by mouth 2 (two) times daily. 04/23/16  Yes Historical Provider, MD  ergocalciferol (VITAMIN D2)  50000 UNITS capsule Take 50,000 Units by mouth once a week. mondays   Yes Historical Provider, MD  losartan-hydrochlorothiazide (HYZAAR) 100-25 MG tablet Take 1 tablet by mouth daily.   Yes Historical Provider, MD  potassium chloride SA (K-DUR,KLOR-CON) 20 MEQ tablet Take 1 tablet (20 mEq total) by mouth daily. 04/27/16  Yes Shuvon B Rankin, NP  sertraline (ZOLOFT) 100 MG tablet 100 mg. Take 1 tablet daily 08/31/15  Yes Historical Provider, MD  simvastatin (ZOCOR) 20 MG tablet Take 20 mg by mouth every evening. Take 1 tablet daily 11/03/14  Yes Historical Provider, MD  LORazepam (ATIVAN) 1 MG tablet Take 1 tablet (1 mg total) by mouth 2 (two) times daily as needed for anxiety (agitation). Patient not taking: Reported on 04/30/2016 04/27/16   Consuello Closs, NP    Physical Exam: BP 130/72 mmHg  Pulse 111  Temp(Src) 99.1 F (37.3 C) (Axillary)  Resp 24  Ht 5\' 1"  (1.549 m)  Wt 81.647 kg (180 lb)  BMI 34.03 kg/m2  SpO2 100%  General:  Sleeping, not aroused Neck: supple, no masses, trachea mildline  Cardiovascular: RRR, no murmurs or rubs, no peripheral edema  Respiratory: clear to auscultation bilaterally, no wheezes, no crackles  Abdomen: soft, nontender, nondistended, normal bowel tones heard  Skin: dry, no rashes  Psychiatric: not evaluated Neurologic: sleeping         Labs on Admission:  Basic Metabolic Panel:  Recent Labs Lab 04/25/16 1055 04/30/16 1233 04/30/16 1848 05/01/16 0814  NA 125* 128* 128* 131*  K 2.1* 3.2* 4.2 3.7  CL 87* 91* 91* 97*  CO2 23 24  --  23  GLUCOSE 165* 150* 179* 130*  BUN 12 19 25* 17  CREATININE 0.69 0.70 0.60 0.74  CALCIUM 9.2 9.7  --  9.2   Liver Function Tests:  Recent Labs Lab 04/25/16 1055 04/30/16 1233  AST 31 20  ALT 20 19  ALKPHOS 99 111  BILITOT 0.8 0.4  PROT 7.3 7.8  ALBUMIN 4.0 4.1   No results for input(s): LIPASE, AMYLASE in the last 168 hours. No results for input(s): AMMONIA in the last 168 hours. CBC:  Recent  Labs Lab 04/25/16 1055 04/30/16 1233 04/30/16 1848 05/01/16 0814  WBC 7.9 10.9*  --  11.1*  NEUTROABS  --   --   --  8.3*  HGB 13.3 13.9 14.6 14.3  HCT 34.2* 38.4 43.0 41.2  MCV 81.8 85.1  --  87.7  PLT 386 514*  --  547*   Cardiac Enzymes: No results for input(s): CKTOTAL, CKMB, CKMBINDEX, TROPONINI in the last 168 hours.  BNP (last 3 results) No results for input(s): BNP in the last 8760 hours.  ProBNP (last 3 results) No results for input(s): PROBNP in the last 8760 hours.  CBG: No results for input(s): GLUCAP in the last 168 hours.  Radiological Exams on Admission: Dg Chest 2 View  05/01/2016  CLINICAL DATA:  Altered mental status, fever, code sepsis EXAM: CHEST  2 VIEW COMPARISON:  04/22/2016 FINDINGS: Cardiomediastinal silhouette is stable. No acute infiltrate or pleural effusion. No pulmonary edema. Bony thorax is unremarkable. IMPRESSION: No active cardiopulmonary disease. Electronically Signed   By: Lahoma Crocker M.D.   On: 05/01/2016 09:47   Dg Chest 2 View  04/30/2016  CLINICAL DATA:  Medical clearance for behavioral health. History of hypertension EXAM: CHEST  2 VIEW COMPARISON:  07/02/2014 FINDINGS: Low lung volumes. Heart and mediastinal contours are within normal limits. No focal opacities or effusions. No acute bony abnormality. IMPRESSION: No active cardiopulmonary disease. Electronically Signed   By: Rolm Baptise M.D.   On: 04/30/2016 13:36    Assessment/Plan Present on Admission:  . Sepsis ([place]) - admitted obs status at request of ER given developing tachycardia, fever, wbc elevation. She had lactate over 4 but this dramatically improved after 2 liters of fluid. This may have been somewhat spurious. In any case, will cover for potential infection with antibiotics and check cultures. If she improves I would cut back or discontinue IV antibiotics quickly. Active Problems:   Brief psychotic disorder - for inpt psych admit. Anticipate she will be ready for unit  admission very soon.   Essential hypertension - cont home meds   Hyperlipidemia   Graves disease - check TSH, this may explain her behavior    DVT prophylaxis: Lovenox   Code Status: FULL   Family Communication: None present.   Disposition Plan: To inpt psych.   Consults called: None   Admission status: Obs tele   Time spent: 24 minutes  Mir Marry Guan MD [facility] Pager [phone]  If 7PM-7AM, please contact night-coverage [email or web address] Password [facility]  05/01/2016, 12:53 PM

## 2016-05-01 NOTE — Progress Notes (Signed)
Pharmacy Antibiotic Note  Heather Wilson is a 70 y.o. female admitted on 04/30/2016 with sepsis.  Pharmacy has been consulted for Vancomycin and Zosyn dosing. First doses ordered in ED.  No antibiotic allergies noted.    CrCl ~ 66 ml/min CG (N60) with rounded SCr 1  Plan: Zosyn 3.375g IV q8h (infuse over 4 hours) Vancomycin 1500mg  IV x1, then 750mg  q12h F/u renal function, cultures, VT at Css, clinical course   Height: 5\' 6"  (167.6 cm) Weight: 178 lb (80.74 kg) IBW/kg (Calculated) : 59.3  Temp (24hrs), Avg:98.8 F (37.1 C), Min:97.7 F (36.5 C), Max:100.4 F (38 C)   Recent Labs Lab 04/25/16 1055 04/30/16 1233 04/30/16 1848 05/01/16 0814  WBC 7.9 10.9*  --  11.1*  CREATININE 0.69 0.70 0.60 0.74    Estimated Creatinine Clearance: 70.1 mL/min (by C-G formula based on Cr of 0.74).    Allergies  Allergen Reactions  . Codeine     Increased anxiety    Antimicrobials this admission: 6/29 Vancomycin >>  6/29 Zosyn >>   Dose adjustments this admission:   Microbiology results: 6/29 BCx: sent 6/29 UCx: ordered   Thank you for allowing pharmacy to be a part of this patient's care.  Ralene Bathe, PharmD, BCPS 05/01/2016, 9:35 AM  Pager: [phone]

## 2016-05-01 NOTE — ED Notes (Signed)
Pt's daughter, Mercie Eon, can be reached at [phone] Pt's husband, Emilya Dewitt, can be reached at [phone]

## 2016-05-02 DIAGNOSIS — I10 Essential (primary) hypertension: Secondary | ICD-10-CM

## 2016-05-02 DIAGNOSIS — R6510 Systemic inflammatory response syndrome (SIRS) of non-infectious origin without acute organ dysfunction: Secondary | ICD-10-CM | POA: Diagnosis not present

## 2016-05-02 DIAGNOSIS — N390 Urinary tract infection, site not specified: Secondary | ICD-10-CM | POA: Diagnosis not present

## 2016-05-02 DIAGNOSIS — F23 Brief psychotic disorder: Secondary | ICD-10-CM | POA: Diagnosis not present

## 2016-05-02 DIAGNOSIS — F411 Generalized anxiety disorder: Secondary | ICD-10-CM | POA: Diagnosis not present

## 2016-05-02 DIAGNOSIS — E785 Hyperlipidemia, unspecified: Secondary | ICD-10-CM

## 2016-05-02 LAB — COMPREHENSIVE METABOLIC PANEL
ALBUMIN: 3 g/dL — AB (ref 3.5–5.0)
ALT: 17 U/L (ref 14–54)
AST: 31 U/L (ref 15–41)
Alkaline Phosphatase: 85 U/L (ref 38–126)
Anion gap: 8 (ref 5–15)
BUN: 13 mg/dL (ref 6–20)
CHLORIDE: 106 mmol/L (ref 101–111)
CO2: 23 mmol/L (ref 22–32)
CREATININE: 0.71 mg/dL (ref 0.44–1.00)
Calcium: 8.1 mg/dL — ABNORMAL LOW (ref 8.9–10.3)
GFR calc Af Amer: >60 mL/min (ref >60)
GFR calc non Af Amer: >60 mL/min (ref >60)
Glucose, Bld: 96 mg/dL (ref 65–99)
POTASSIUM: 3.1 mmol/L — AB (ref 3.5–5.1)
SODIUM: 137 mmol/L (ref 135–145)
Total Bilirubin: 0.6 mg/dL (ref 0.3–1.2)
Total Protein: 5.8 g/dL — ABNORMAL LOW (ref 6.5–8.1)

## 2016-05-02 LAB — CBC
HCT: 32.9 % — ABNORMAL LOW (ref 36.0–46.0)
Hemoglobin: 11.1 g/dL — ABNORMAL LOW (ref 12.0–15.0)
MCH: 30.2 pg (ref 26.0–34.0)
MCHC: 33.7 g/dL (ref 30.0–36.0)
MCV: 89.6 fL (ref 78.0–100.0)
PLATELETS: 413 10*3/uL — AB (ref 150–400)
RBC: 3.67 MIL/uL — AB (ref 3.87–5.11)
RDW: 13.9 % (ref 11.5–15.5)
WBC: 6.4 10*3/uL (ref 4.0–10.5)

## 2016-05-02 LAB — MRSA PCR SCREENING: MRSA by PCR: NEGATIVE

## 2016-05-02 MED ORDER — POTASSIUM CHLORIDE CRYS ER 20 MEQ PO TBCR
40.0000 meq | EXTENDED_RELEASE_TABLET | Freq: Once | ORAL | Status: AC
  Administered 2016-05-02: 40 meq via ORAL
  Filled 2016-05-02: qty 2

## 2016-05-02 MED ORDER — QUETIAPINE FUMARATE 25 MG PO TABS
25.0000 mg | ORAL_TABLET | Freq: Two times a day (BID) | ORAL | Status: DC
  Administered 2016-05-02 – 2016-05-03 (×3): 25 mg via ORAL
  Filled 2016-05-02 (×3): qty 1

## 2016-05-02 MED ORDER — LEVOFLOXACIN 750 MG PO TABS: 750.0000 mg | ORAL_TABLET | Freq: Every day | ORAL | Status: DC

## 2016-05-02 MED ORDER — LEVOFLOXACIN IN D5W 750 MG/150ML IV SOLN: 750.0000 mg | Freq: Once | INTRAVENOUS | Status: DC

## 2016-05-02 NOTE — Care Management Obs Status (Signed)
[place] NOTIFICATION   Patient Details  Name: Heather Wilson MRN: [id number] Date of Birth: 11-Jun-1946   Medicare Observation Status Notification Given:  Yes    Purcell Mouton, RN 05/02/2016, 2:41 PM

## 2016-05-02 NOTE — Progress Notes (Signed)
Pharmacy Antibiotic Note  Heather Wilson is a 70 y.o. female admitted on 04/30/2016 with sepsis.  Pharmacy was consulted for Vancomycin and Zosyn dosing. No antibiotic allergies noted.  Now de-escalating Zosyn to Levaquin for CAP. CXR was normal on 6/29.  05/02/2016  Renal fxn stable.  CrCl ~ 60 ml/min   Afebrile  Leukocytosis resolved, LA normalized  Plan:  Levaquin 750mg  IV x1 now then 750mg  PO q24h  Continue Vancomycin 750mg  IV q12h Check Vancomycin trough at steady state Monitor renal function and cx data  Check MRSA PCR and consider d/c Vancomycin if negative  Height: 5\' 1"  (154.9 cm) Weight: 179 lb 10.8 oz (81.5 kg) IBW/kg (Calculated) : 47.8  Temp (24hrs), Avg:98.9 F (37.2 C), Min:97.8 F (36.6 C), Max:100.4 F (38 C)   Recent Labs Lab 04/25/16 1055 04/30/16 1233 04/30/16 1848 05/01/16 0814 05/01/16 0926 05/01/16 1214 05/01/16 1231 05/02/16 0514  WBC 7.9 10.9*  --  11.1*  --   --  9.2 6.4  CREATININE 0.69 0.70 0.60 0.74  --   --  0.84 0.71  LATICACIDVEN  --   --   --   --  4.12* 0.98  --   --     Estimated Creatinine Clearance: 63.3 mL/min (by C-G formula based on Cr of 0.71).    Allergies  Allergen Reactions  . Codeine     Increased anxiety    Antimicrobials this admission: 6/29 Vancomycin >>   6/29 Zosyn >> 6/30 6/30 Levaquin >>  Dose adjustments this admission:  Microbiology results: 6/29 BCx: sent 6/29 UCx: ordered  6/30 MRSA PCR 6/30 Nasal swab:  Thank you for allowing pharmacy to be a part of this patient's care.  Netta Cedars, PharmD, BCPS Pager: [phone] 05/02/2016, 8:44 AM

## 2016-05-02 NOTE — Progress Notes (Addendum)
PROGRESS NOTE    Heather Wilson  [id number] DOB: August 30, 1946 DOA: 04/30/2016  PCP: Shirline Frees, MD   Brief Narrative:  70 y/o with anxiety, depression and h/o psychosis who had an inpatient admission for a psychotic episode last year. She began to exhibit the same type of behavior over the past few weeks and was brought in by her daughter. She was awaiting placement at a psych facility when she became tachycardic and developed a low grade fever. She did not exhibit any other signs or symptoms of infection. UA was negative and CXR was clear. She was admitted and started on Vanc and Zosyn.  Of note, on reviewing her records, it appears she was recently switched from Ativan to Tranxene which apparently was not helping. It is to be noted that Tranxene was started in the ER on 6/28 at a dose of 3.75 mg BID and she received 3 doses. She was also receiving Zoloft and then received a dose of Haldol at 2 mg PO near 8 AM on 7/29 for increasing agitation. She also had complaints of chest pain and dyspnea. HR was elevated prior to Haldol being given and subsequently worsened. Fever was 100.4 rectally.   Subjective: Confused. States there is an investigation going on and she is not allowed to discuss it. States she needs to get everything straight and needs to see Dr Delice Lesch. No c/o anxiety, cough, sore throat, ear ache, nausea, vomiting, diarrhea, pain.   Assessment & Plan:   Principal Problem:   SIRS (systemic inflammatory response syndrome)  - not sepsis a no source of infection has yet been identified -2 view cxr x 2 6/28 and 6/29 negative  - will stop antibiotics and monitor closely over 48 hrs for fever and recurrence of tachycardia- blood cultures were obtained- will follow - suspect fever, tachycardia and lactic acidosis was a result of anxiety superimposed by new start on Tranxene and dose of Haldol when she was on Zoloft but need to follow cultures and need to follow for recurrence off of  antibiotics.   Active Problems:   Brief psychotic disorder- anxiety and depression - start low dose Seroquel - Cont Zoloft     Essential hypertension - Norvasc, Losartan, HCTZ    Hyperlipidemia - Zocor   DVT prophylaxis: Lovenox Code Status: Full code Family Communication:  Disposition Plan: to psych when stable Consultants:    Procedures:    Antimicrobials:  Anti-infectives    Start     Dose/Rate Route Frequency Ordered Stop   05/03/16 1000  levofloxacin (LEVAQUIN) tablet 750 mg     750 mg Oral Daily 05/02/16 0856     05/02/16 0900  levofloxacin (LEVAQUIN) IVPB 750 mg  Status:  Discontinued     750 mg 100 mL/hr over 90 Minutes Intravenous  Once 05/02/16 0844 05/02/16 0922   05/01/16 2200  vancomycin (VANCOCIN) IVPB 750 mg/150 ml premix     750 mg 150 mL/hr over 60 Minutes Intravenous Every 12 hours 05/01/16 0936     05/01/16 1800  piperacillin-tazobactam (ZOSYN) IVPB 3.375 g  Status:  Discontinued     3.375 g 12.5 mL/hr over 240 Minutes Intravenous Every 8 hours 05/01/16 0937 05/02/16 0805   05/01/16 0945  vancomycin (VANCOCIN) 1,500 mg in sodium chloride 0.9 % 500 mL IVPB     1,500 mg 250 mL/hr over 120 Minutes Intravenous STAT 05/01/16 0931 05/01/16 1155   05/01/16 0930  piperacillin-tazobactam (ZOSYN) IVPB 3.375 g     3.375 g 100 mL/hr  over 30 Minutes Intravenous  Once 05/01/16 0927 05/01/16 1024   05/01/16 0930  vancomycin (VANCOCIN) IVPB 1000 mg/200 mL premix  Status:  Discontinued     1,000 mg 200 mL/hr over 60 Minutes Intravenous  Once 05/01/16 0927 05/01/16 0931       Objective: Filed Vitals:   05/01/16 1235 05/01/16 1500 05/01/16 2122 05/02/16 0533  BP: 130/72 122/67 160/75 135/80  Pulse: 111 107 104 85  Temp: 99.1 F (37.3 C) 98.4 F (36.9 C) 98.1 F (36.7 C) 97.8 F (36.6 C)  TempSrc: Axillary Axillary Oral Oral  Resp: 24 18 18 18   Height:  5\' 1"  (1.549 m)    Weight:  81.5 kg (179 lb 10.8 oz)    SpO2: 100% 97% 100% 100%    Intake/Output  Summary (Last 24 hours) at 05/02/16 0941 Last data filed at 05/02/16 0743  Gross per 24 hour  Intake 854.17 ml  Output   3200 ml  Net -2345.83 ml   Filed Weights   04/30/16 1211 05/01/16 1006 05/01/16 1500  Weight: 80.74 kg (178 lb) 81.647 kg (180 lb) 81.5 kg (179 lb 10.8 oz)    Examination: General exam: Appears comfortable  HEENT: PERRLA, oral mucosa moist, no sclera icterus or thrush Respiratory system: Clear to auscultation. Respiratory effort normal. Cardiovascular system: S1 & S2 heard, RRR.  No murmurs  Gastrointestinal system: Abdomen soft, non-tender, nondistended. Normal bowel sound. No organomegaly Central nervous system: Alert and oriented. No focal neurological deficits. Extremities: No cyanosis, clubbing or edema Skin: No rashes or ulcers Psychiatry: very paranoid- does not appear agitated at this time    Data Reviewed: I have personally reviewed following labs and imaging studies  CBC:  Recent Labs Lab 04/25/16 1055 04/30/16 1233 04/30/16 1848 05/01/16 0814 05/01/16 1231 05/02/16 0514  WBC 7.9 10.9*  --  11.1* 9.2 6.4  NEUTROABS  --   --   --  8.3*  --   --   HGB 13.3 13.9 14.6 14.3 10.4* 11.1*  HCT 34.2* 38.4 43.0 41.2 29.9* 32.9*  MCV 81.8 85.1  --  87.7 88.7 89.6  PLT 386 514*  --  547* 405* 123XX123*   Basic Metabolic Panel:  Recent Labs Lab 04/25/16 1055 04/30/16 1233 04/30/16 1848 05/01/16 0814 05/01/16 1231 05/02/16 0514  NA 125* 128* 128* 131*  --  137  K 2.1* 3.2* 4.2 3.7  --  3.1*  CL 87* 91* 91* 97*  --  106  CO2 23 24  --  23  --  23  GLUCOSE 165* 150* 179* 130*  --  96  BUN 12 19 25* 17  --  13  CREATININE 0.69 0.70 0.60 0.74 0.84 0.71  CALCIUM 9.2 9.7  --  9.2  --  8.1*   GFR: Estimated Creatinine Clearance: 63.3 mL/min (by C-G formula based on Cr of 0.71). Liver Function Tests:  Recent Labs Lab 04/25/16 1055 04/30/16 1233 05/02/16 0514  AST 31 20 31   ALT 20 19 17   ALKPHOS 99 111 85  BILITOT 0.8 0.4 0.6  PROT 7.3 7.8  5.8*  ALBUMIN 4.0 4.1 3.0*   No results for input(s): LIPASE, AMYLASE in the last 168 hours. No results for input(s): AMMONIA in the last 168 hours. Coagulation Profile: No results for input(s): INR, PROTIME in the last 168 hours. Cardiac Enzymes: No results for input(s): CKTOTAL, CKMB, CKMBINDEX, TROPONINI in the last 168 hours. BNP (last 3 results) No results for input(s): PROBNP in the last 8760 hours.  HbA1C: No results for input(s): HGBA1C in the last 72 hours. CBG: No results for input(s): GLUCAP in the last 168 hours. Lipid Profile: No results for input(s): CHOL, HDL, LDLCALC, TRIG, CHOLHDL, LDLDIRECT in the last 72 hours. Thyroid Function Tests:  Recent Labs  04/30/16 1233 05/01/16 1231  TSH 1.178 2.276  FREET4 1.36*  --    Anemia Panel: No results for input(s): VITAMINB12, FOLATE, FERRITIN, TIBC, IRON, RETICCTPCT in the last 72 hours. Urine analysis:    Component Value Date/Time   COLORURINE YELLOW 05/01/2016 1023   APPEARANCEUR CLEAR 05/01/2016 1023   LABSPEC 1.012 05/01/2016 1023   PHURINE 6.5 05/01/2016 1023   GLUCOSEU NEGATIVE 05/01/2016 1023   HGBUR NEGATIVE 05/01/2016 1023   BILIRUBINUR NEGATIVE 05/01/2016 1023   KETONESUR NEGATIVE 05/01/2016 1023   PROTEINUR 30* 05/01/2016 1023   NITRITE NEGATIVE 05/01/2016 1023   LEUKOCYTESUR NEGATIVE 05/01/2016 1023   Sepsis Labs: @LABRCNTIP (procalcitonin:4,lacticidven:4) )No results found for this or any previous visit (from the past 240 hour(s)).       Radiology Studies: Dg Chest 2 View  05/01/2016  CLINICAL DATA:  Altered mental status, fever, code sepsis EXAM: CHEST  2 VIEW COMPARISON:  04/22/2016 FINDINGS: Cardiomediastinal silhouette is stable. No acute infiltrate or pleural effusion. No pulmonary edema. Bony thorax is unremarkable. IMPRESSION: No active cardiopulmonary disease. Electronically Signed   By: Lahoma Crocker M.D.   On: 05/01/2016 09:47   Dg Chest 2 View  04/30/2016  CLINICAL DATA:  Medical  clearance for behavioral health. History of hypertension EXAM: CHEST  2 VIEW COMPARISON:  07/02/2014 FINDINGS: Low lung volumes. Heart and mediastinal contours are within normal limits. No focal opacities or effusions. No acute bony abnormality. IMPRESSION: No active cardiopulmonary disease. Electronically Signed   By: Rolm Baptise M.D.   On: 04/30/2016 13:36      Scheduled Meds: . amLODipine  2.5 mg Oral Daily  . aspirin  81 mg Oral Daily  . enoxaparin (LOVENOX) injection  40 mg Subcutaneous Q24H  . losartan  100 mg Oral Daily   And  . hydrochlorothiazide  25 mg Oral Daily  . [START ON 05/03/2016] levofloxacin  750 mg Oral Daily  . potassium chloride SA  20 mEq Oral Daily  . potassium chloride  40 mEq Oral Once  . QUEtiapine  25 mg Oral BID  . sertraline  100 mg Oral Daily  . simvastatin  20 mg Oral QPM  . sodium chloride  500 mL Intravenous Once  . vancomycin  750 mg Intravenous Q12H   Continuous Infusions:    LOS: 1 day    Time spent in minutes: 89    [place], MD [facility] Pager: [email or web address] Password [facility] 05/02/2016, 9:41 AM

## 2016-05-03 DIAGNOSIS — F333 Major depressive disorder, recurrent, severe with psychotic symptoms: Secondary | ICD-10-CM | POA: Diagnosis present

## 2016-05-03 DIAGNOSIS — R6510 Systemic inflammatory response syndrome (SIRS) of non-infectious origin without acute organ dysfunction: Secondary | ICD-10-CM | POA: Diagnosis not present

## 2016-05-03 DIAGNOSIS — N390 Urinary tract infection, site not specified: Secondary | ICD-10-CM

## 2016-05-03 DIAGNOSIS — D473 Essential (hemorrhagic) thrombocythemia: Secondary | ICD-10-CM

## 2016-05-03 DIAGNOSIS — F411 Generalized anxiety disorder: Secondary | ICD-10-CM

## 2016-05-03 DIAGNOSIS — F23 Brief psychotic disorder: Secondary | ICD-10-CM | POA: Diagnosis not present

## 2016-05-03 DIAGNOSIS — E0500 Thyrotoxicosis with diffuse goiter without thyrotoxic crisis or storm: Secondary | ICD-10-CM

## 2016-05-03 DIAGNOSIS — A419 Sepsis, unspecified organism: Secondary | ICD-10-CM

## 2016-05-03 DIAGNOSIS — I10 Essential (primary) hypertension: Secondary | ICD-10-CM | POA: Diagnosis not present

## 2016-05-03 DIAGNOSIS — D75839 Thrombocytosis, unspecified: Secondary | ICD-10-CM

## 2016-05-03 LAB — URINE CULTURE: Culture: >=100000 — AB

## 2016-05-03 MED ORDER — QUETIAPINE FUMARATE 25 MG PO TABS: 50.0000 mg | ORAL_TABLET | Freq: Every day | ORAL | Status: DC

## 2016-05-03 MED ORDER — QUETIAPINE FUMARATE 25 MG PO TABS: 25.0000 mg | ORAL_TABLET | Freq: Every day | ORAL | Status: DC

## 2016-05-03 MED ORDER — QUETIAPINE FUMARATE 25 MG PO TABS: 25.0000 mg | ORAL_TABLET | Freq: Two times a day (BID) | ORAL | Status: DC

## 2016-05-03 MED ORDER — AMOXICILLIN 250 MG PO CAPS
500.0000 mg | ORAL_CAPSULE | Freq: Three times a day (TID) | ORAL | Status: DC
  Administered 2016-05-03 (×2): 500 mg via ORAL
  Administered 2016-05-04: 250 mg via ORAL
  Filled 2016-05-03 (×3): qty 2

## 2016-05-03 MED ORDER — AMOXICILLIN 500 MG PO CAPS: 500.0000 mg | ORAL_CAPSULE | Freq: Three times a day (TID) | ORAL | Status: DC

## 2016-05-03 MED ORDER — LORAZEPAM 2 MG/ML IJ SOLN
1.0000 mg | Freq: Once | INTRAMUSCULAR | Status: AC
  Administered 2016-05-03: 1 mg via INTRAVENOUS
  Filled 2016-05-03: qty 1

## 2016-05-03 MED ORDER — QUETIAPINE FUMARATE 50 MG PO TABS: 50.0000 mg | ORAL_TABLET | Freq: Every day | ORAL | Status: DC

## 2016-05-03 NOTE — Clinical Social Work Note (Signed)
CSW provided IVC paperwork for MD signature.  Papers signed and will be faxed to the magistrate.  Dede Query, LCSW [place] Worker - Weekend Coverage cell #: [phone]

## 2016-05-03 NOTE — Discharge Summary (Addendum)
Physician Discharge Summary  Heather Wilson [id number] DOB: 12-04-1945 DOA: 04/30/2016  PCP: Shirline Frees, MD  Admit date: 04/30/2016 Discharge date: 05/03/2016  Admitted From: home Disposition:  Psych inpatient    Recommendations for Outpatient Follow-up:  1. Will go to inpatient psych 2. PCP to follow thrombocytosis periodically   Home Health:  none  Equipment/Devices:  none    Discharge Condition:  medically stable for inpatient psych CODE STATUS:  Full code   Diet recommendation:  Heart healthy Consultations:  psych   Discharge Diagnoses:  Principal Problem:   Sepsis ([place]) Active Problems:   Brief psychotic disorder   Urinary tract infection, site not specified   Essential hypertension   Hyperlipidemia   GAD (generalized anxiety disorder)   Thrombocytosis (HCC)   Severe recurrent major depression with psychotic features ([place])   Brief Summary: 70 y/o with anxiety, depression and h/o psychosis who had an inpatient admission for a psychotic episode last year. She began to exhibit the same type of behavior over the past few weeks and was brought in by her daughter on 6/23. She was in the ER holding area awaiting placement at a psych facility when, on 6/28, she became tachycardic, developed a low grade fever and lactic acidosis.  She did not exhibit any other signs or symptoms of infection. UA was negative for bacteria and nitrites and had 0-5 WBC.  CXR was clear. She was admitted and started on Vanc and Zosyn.   Of note, on reviewing her records, it appears she was recently switched from Ativan to Tranxene which apparently was not helping. It is to be noted that Tranxene was started in the ER on 6/28 at a dose of 3.75 mg BID and she received 3 doses. She was also receiving Zoloft and then received a dose of Haldol at 2 mg PO near 8 AM on 7/29 for increasing agitation. She also had complaints of chest pain and dyspnea. HR was elevated prior to Haldol being given and  subsequently worsened. Fever was 100.4 rectally.   Hospital Course:  Principal Problem: UTI/ sepsis  - fever, tachycardia, mild leukocytosis, lactic acidosis- have resolved -2 view cxr x 2 6/28 and 6/29 negative  - UA negative but oddly, U culture + for Enteroccocus- sensitive to Amoxil-  started Amoxil today for a 3 day course  Active Problems:  Brief psychotic disorder- anxiety and depression - started low dose Seroquel - Cont Zoloft  - restless at night- received Ativan and slept for hours    Essential hypertension - Norvasc, Losartan, HCTZ   Hyperlipidemia - Zocor  Discharge Instructions      Discharge Instructions    Diet - low sodium heart healthy    Complete by:  As directed      Increase activity slowly    Complete by:  As directed             Medication List    STOP taking these medications        clorazepate 3.75 MG tablet  Commonly known as:  TRANXENE     LORazepam 1 MG tablet  Commonly known as:  ATIVAN      TAKE these medications        amLODipine 2.5 MG tablet  Commonly known as:  NORVASC  Take 2.5 mg by mouth daily.     amoxicillin 500 MG capsule  Commonly known as:  AMOXIL  Take 1 capsule (500 mg total) by mouth every 8 (eight) hours.  aspirin 81 MG tablet  Take 81 mg by mouth daily.     ergocalciferol 50000 units capsule  Commonly known as:  VITAMIN D2  Take 50,000 Units by mouth once a week. mondays     losartan-hydrochlorothiazide 100-25 MG tablet  Commonly known as:  HYZAAR  Take 1 tablet by mouth daily.     potassium chloride SA 20 MEQ tablet  Commonly known as:  K-DUR,KLOR-CON  Take 1 tablet (20 mEq total) by mouth daily.     QUEtiapine 25 MG tablet  Commonly known as:  SEROQUEL  Take 1 tablet (25 mg total) by mouth daily.     QUEtiapine 50 MG tablet  Commonly known as:  SEROQUEL  Take 1 tablet (50 mg total) by mouth at bedtime.  Start taking on:  05/04/2016     sertraline 100 MG tablet  Commonly known as:   ZOLOFT  100 mg. Take 1 tablet daily     simvastatin 20 MG tablet  Commonly known as:  ZOCOR  Take 20 mg by mouth every evening. Take 1 tablet daily        Allergies  Allergen Reactions  . Codeine     Increased anxiety     Procedures/Studies: Dg Chest 2 View  05/01/2016  CLINICAL DATA:  Altered mental status, fever, code sepsis EXAM: CHEST  2 VIEW COMPARISON:  04/22/2016 FINDINGS: Cardiomediastinal silhouette is stable. No acute infiltrate or pleural effusion. No pulmonary edema. Bony thorax is unremarkable. IMPRESSION: No active cardiopulmonary disease. Electronically Signed   By: Lahoma Crocker M.D.   On: 05/01/2016 09:47   Dg Chest 2 View  04/30/2016  CLINICAL DATA:  Medical clearance for behavioral health. History of hypertension EXAM: CHEST  2 VIEW COMPARISON:  07/02/2014 FINDINGS: Low lung volumes. Heart and mediastinal contours are within normal limits. No focal opacities or effusions. No acute bony abnormality. IMPRESSION: No active cardiopulmonary disease. Electronically Signed   By: Rolm Baptise M.D.   On: 04/30/2016 13:36     Discharge Exam: Filed Vitals:   05/03/16 0525 05/03/16 1206  BP: 150/98 172/95  Pulse: 98 102  Temp: 97.8 F (36.6 C) 98.6 F (37 C)  Resp: 18 20   Filed Vitals:   05/02/16 1546 05/02/16 2355 05/03/16 0525 05/03/16 1206  BP: 142/77 147/81 150/98 172/95  Pulse: 97 104 98 102  Temp: 98.4 F (36.9 C) 98.6 F (37 C) 97.8 F (36.6 C) 98.6 F (37 C)  TempSrc: Oral Oral Axillary Oral  Resp: 18 18 18 20   Height:      Weight:      SpO2: 98% 99% 100% 97%    General: Pt is alert, awake, not in acute distress Cardiovascular: RRR, S1/S2 +, no rubs, no gallops Respiratory: CTA bilaterally, no wheezing, no rhonchi Abdominal: Soft, NT, ND, bowel sounds + Extremities: no edema, no cyanosis    The results of significant diagnostics from this hospitalization (including imaging, microbiology, ancillary and laboratory) are listed below for reference.      Microbiology: Recent Results (from the past 240 hour(s))  Blood culture (routine x 2)     Status: None (Preliminary result)   Collection Time: 05/01/16  9:10 AM  Result Value Ref Range Status   Specimen Description BLOOD LEFT FOREARM  Final   Special Requests BOTTLES DRAWN AEROBIC AND ANAEROBIC 5CC  Final   Culture   Final    NO GROWTH 2 DAYS Performed at [facility]    Report Status PENDING  Incomplete  Blood culture (routine x 2)     Status: None (Preliminary result)   Collection Time: 05/01/16  9:50 AM  Result Value Ref Range Status   Specimen Description BLOOD RIGHT ANTECUBITAL  Final   Special Requests BOTTLES DRAWN AEROBIC AND ANAEROBIC 5CC  Final   Culture   Final    NO GROWTH 2 DAYS Performed at [facility]    Report Status PENDING  Incomplete  Urine culture     Status: Abnormal   Collection Time: 05/01/16 10:23 AM  Result Value Ref Range Status   Specimen Description URINE, CATHETERIZED  Final   Special Requests NONE  Final   Culture >=100,000 COLONIES/mL ENTEROCOCCUS SPECIES (A)  Final   Report Status 05/03/2016 FINAL  Final   Organism ID, Bacteria ENTEROCOCCUS SPECIES (A)  Final      Susceptibility   Enterococcus species - MIC*    AMPICILLIN <=2 SENSITIVE Sensitive     LEVOFLOXACIN 2 SENSITIVE Sensitive     NITROFURANTOIN <=16 SENSITIVE Sensitive     VANCOMYCIN 1 SENSITIVE Sensitive     * >=100,000 COLONIES/mL ENTEROCOCCUS SPECIES  MRSA PCR Screening     Status: None   Collection Time: 05/02/16  6:08 PM  Result Value Ref Range Status   MRSA by PCR NEGATIVE NEGATIVE Final    Comment:        The GeneXpert MRSA Assay (FDA approved for NASAL specimens only), is one component of a comprehensive MRSA colonization surveillance program. It is not intended to diagnose MRSA infection nor to guide or monitor treatment for MRSA infections.      Labs: BNP (last 3 results) No results for input(s): BNP in the last 8760 hours. Basic Metabolic  Panel:  Recent Labs Lab 04/30/16 1233 04/30/16 1848 05/01/16 0814 05/01/16 1231 05/02/16 0514  NA 128* 128* 131*  --  137  K 3.2* 4.2 3.7  --  3.1*  CL 91* 91* 97*  --  106  CO2 24  --  23  --  23  GLUCOSE 150* 179* 130*  --  96  BUN 19 25* 17  --  13  CREATININE 0.70 0.60 0.74 0.84 0.71  CALCIUM 9.7  --  9.2  --  8.1*   Liver Function Tests:  Recent Labs Lab 04/30/16 1233 05/02/16 0514  AST 20 31  ALT 19 17  ALKPHOS 111 85  BILITOT 0.4 0.6  PROT 7.8 5.8*  ALBUMIN 4.1 3.0*   No results for input(s): LIPASE, AMYLASE in the last 168 hours. No results for input(s): AMMONIA in the last 168 hours. CBC:  Recent Labs Lab 04/30/16 1233 04/30/16 1848 05/01/16 0814 05/01/16 1231 05/02/16 0514  WBC 10.9*  --  11.1* 9.2 6.4  NEUTROABS  --   --  8.3*  --   --   HGB 13.9 14.6 14.3 10.4* 11.1*  HCT 38.4 43.0 41.2 29.9* 32.9*  MCV 85.1  --  87.7 88.7 89.6  PLT 514*  --  547* 405* 413*   Cardiac Enzymes: No results for input(s): CKTOTAL, CKMB, CKMBINDEX, TROPONINI in the last 168 hours. BNP: Invalid input(s): POCBNP CBG: No results for input(s): GLUCAP in the last 168 hours. D-Dimer No results for input(s): DDIMER in the last 72 hours. Hgb A1c No results for input(s): HGBA1C in the last 72 hours. Lipid Profile No results for input(s): CHOL, HDL, LDLCALC, TRIG, CHOLHDL, LDLDIRECT in the last 72 hours. Thyroid function studies  Recent Labs  05/01/16 1231  TSH 2.276  Anemia work up No results for input(s): VITAMINB12, FOLATE, FERRITIN, TIBC, IRON, RETICCTPCT in the last 72 hours. Urinalysis    Component Value Date/Time   COLORURINE YELLOW 05/01/2016 1023   APPEARANCEUR CLEAR 05/01/2016 1023   LABSPEC 1.012 05/01/2016 1023   PHURINE 6.5 05/01/2016 1023   GLUCOSEU NEGATIVE 05/01/2016 1023   HGBUR NEGATIVE 05/01/2016 1023   [place] 05/01/2016 1023   [place] 05/01/2016 1023   PROTEINUR 30* 05/01/2016 1023   NITRITE NEGATIVE 05/01/2016  1023   LEUKOCYTESUR NEGATIVE 05/01/2016 1023   Sepsis Labs Invalid input(s): PROCALCITONIN,  WBC,  LACTICIDVEN Microbiology Recent Results (from the past 240 hour(s))  Blood culture (routine x 2)     Status: None (Preliminary result)   Collection Time: 05/01/16  9:10 AM  Result Value Ref Range Status   Specimen Description BLOOD LEFT FOREARM  Final   Special Requests BOTTLES DRAWN AEROBIC AND ANAEROBIC 5CC  Final   Culture   Final    NO GROWTH 2 DAYS Performed at [facility]    Report Status PENDING  Incomplete  Blood culture (routine x 2)     Status: None (Preliminary result)   Collection Time: 05/01/16  9:50 AM  Result Value Ref Range Status   Specimen Description BLOOD RIGHT ANTECUBITAL  Final   Special Requests BOTTLES DRAWN AEROBIC AND ANAEROBIC 5CC  Final   Culture   Final    NO GROWTH 2 DAYS Performed at [facility]    Report Status PENDING  Incomplete  Urine culture     Status: Abnormal   Collection Time: 05/01/16 10:23 AM  Result Value Ref Range Status   Specimen Description URINE, CATHETERIZED  Final   Special Requests NONE  Final   Culture >=100,000 COLONIES/mL ENTEROCOCCUS SPECIES (A)  Final   Report Status 05/03/2016 FINAL  Final   Organism ID, Bacteria ENTEROCOCCUS SPECIES (A)  Final      Susceptibility   Enterococcus species - MIC*    AMPICILLIN <=2 SENSITIVE Sensitive     LEVOFLOXACIN 2 SENSITIVE Sensitive     NITROFURANTOIN <=16 SENSITIVE Sensitive     VANCOMYCIN 1 SENSITIVE Sensitive     * >=100,000 COLONIES/mL ENTEROCOCCUS SPECIES  MRSA PCR Screening     Status: None   Collection Time: 05/02/16  6:08 PM  Result Value Ref Range Status   MRSA by PCR NEGATIVE NEGATIVE Final    Comment:        The GeneXpert MRSA Assay (FDA approved for NASAL specimens only), is one component of a comprehensive MRSA colonization surveillance program. It is not intended to diagnose MRSA infection nor to guide or monitor treatment for MRSA  infections.      Time coordinating discharge: Over 30 minutes  SIGNED:   Debbe Odea, MD  [facility] 05/03/2016, 4:16 PM Pager   If 7PM-7AM, please contact night-coverage [email or web address] Password [email or web address]

## 2016-05-03 NOTE — Clinical Social Work Note (Signed)
CSW spoke with [facility] Nearing who stated that their psychiatry doctor has approved patient for inpatient.  [place]'s medical doctor will need to review and we are still waiting to hear back.  CSW provided [place] with the direct cell phone of the RN working with patient [phone] (on the fourth floor [place])  and asked that they coordinate with the fourth floor if patient is being accepted this evening.  Also if patient is accepted this evening the nurses may call the sherriff at [phone] to arrange transport to [facility] as patient is under IVC status and requires sheriff to transport.  Dede Query, LCSW [place] Worker - Weekend Coverage cell #: [phone]

## 2016-05-03 NOTE — Consult Note (Signed)
[facility] Consult   Reason for Consult:  Shortness of breath and panic attack Referring Physician:  Dr.Rizwan Patient Identification: Heather Wilson MRN:  [id number] Principal Diagnosis: Severe recurrent major depression with psychotic features [facility]) Diagnosis:   Patient Active Problem List   Diagnosis Date Noted  . Severe recurrent major depression with psychotic features ([place]) [F33.3] 05/03/2016    Priority: High  . GAD (generalized anxiety disorder) [F41.1] 04/27/2016    Priority: High  . Brief psychotic disorder [F23] 08/31/2014    Priority: High  . Urinary tract infection, site not specified [N39.0] 05/03/2016  . Sepsis ([place]) [A41.9] 05/03/2016  . Thrombocytosis ([place]) [D47.3] 05/03/2016  . Panic attack [F41.0] 04/27/2016  . MDD (major depressive disorder), recurrent severe, without psychosis ([place]) [F33.2] 04/27/2016  . Abnormal EEG [R94.01] 08/31/2014  . Essential hypertension [I10] 08/31/2014  . Hyperlipidemia [E78.5] 08/31/2014    Total Time spent with patient: 55 minutes  Subjective:   Heather Wilson is a 70 y.o. female patient presented with confusion, anxiety, depression and psychosis.  HPI:  Thanks for asking me to do a psychiatric consultation on Heather Wilson, she has history of Dementia, depression and anxiety. Patient is seen today, she is confused, disoriented, combative and psychosis.  Patient reports depressive symptoms, hopelessness, anxiety and apprehension. Patient is a poor historian and it is difficulty for her to articulate her symptoms. Patient is unable to contract for safety.  Past Psychiatric History: as above  Risk to Self: Suicidal Ideation:  (patient unable to confirm or deny) Suicidal Intent:  (unk) Is patient at risk for suicide?:  (unk) Suicidal Plan?:  (unk) Access to Means:  (unk) What has been your use of drugs/alcohol within the last 12 months?:  (no substance use) How many times?:  (0) Other Self Harm Risks:  (None  ) Triggers for Past Attempts: None known Intentional Self Injurious Behavior: None (per daughter) Risk to Others: Homicidal Ideation:  (unable to confirm or deny) Thoughts of Harm to Others:  (unk to confirm or deny) Current Homicidal Intent:  (unk) Current Homicidal Plan:  (unk) Access to Homicidal Means:  (unk) Identified Victim:  (n/a) History of harm to others?:  (unk) Assessment of Violence:  (unk) Violent Behavior Description:  (patient is calm and cooperative ) Does patient have access to weapons?:  (unk) Criminal Charges Pending?:  (unk) Does patient have a court date:  (unk) Prior Inpatient Therapy: Prior Inpatient Therapy: Yes Prior Therapy Dates: 2015 Prior Therapy Facilty/Provider(s): [facility] Reason for Treatment: bipolar manic episode Prior Outpatient Therapy: Prior Outpatient Therapy: Yes Prior Therapy Dates: ongoing Prior Therapy Facilty/Provider(s): Dr. Leonidas Romberg Reason for Treatment: Medication management Does patient have an ACCT team?: No Does patient have Intensive In-House Services?  : No Does patient have [facility] services? : No Does patient have P4CC services?: No  Past Medical History:  Past Medical History  Diagnosis Date  . Hyperlipidemia   . Hypertension   . Anxiety   . Arthritis   . Graves disease     Past Surgical History  Procedure Laterality Date  . Total knee arthroplasty  2011    right   Family History:  Family History  Problem Relation Age of Onset  . Colon cancer Neg Hx   . Stomach cancer Neg Hx    Family Psychiatric  History: Denies Social History:  History  Alcohol Use No     History  Drug Use No    Social History   Social History  . Marital  Status: Married    Spouse Name: N/A  . Number of Children: N/A  . Years of Education: N/A   Social History Main Topics  . Smoking status: Never Smoker   . Smokeless tobacco: Never Used  . Alcohol Use: No  . Drug Use: No  . Sexual Activity: Not Asked   Other Topics Concern   . None   Social History Narrative   Additional Social History:    Allergies:   Allergies  Allergen Reactions  . Codeine     Increased anxiety    Labs:  Results for orders placed or performed during the hospital encounter of 04/30/16 (from the past 48 hour(s))  CBC     Status: Abnormal   Collection Time: 05/02/16  5:14 AM  Result Value Ref Range   WBC 6.4 4.0 - 10.5 K/uL   RBC 3.67 (L) 3.87 - 5.11 MIL/uL   Hemoglobin 11.1 (L) 12.0 - 15.0 g/dL   HCT 32.9 (L) 36.0 - 46.0 %   MCV 89.6 78.0 - 100.0 fL   MCH 30.2 26.0 - 34.0 pg   MCHC 33.7 30.0 - 36.0 g/dL   RDW 13.9 11.5 - 15.5 %   Platelets 413 (H) 150 - 400 K/uL  Comprehensive metabolic panel     Status: Abnormal   Collection Time: 05/02/16  5:14 AM  Result Value Ref Range   Sodium 137 135 - 145 mmol/L   Potassium 3.1 (L) 3.5 - 5.1 mmol/L   Chloride 106 101 - 111 mmol/L   CO2 23 22 - 32 mmol/L   Glucose, Bld 96 65 - 99 mg/dL   BUN 13 6 - 20 mg/dL   Creatinine, Ser 0.71 0.44 - 1.00 mg/dL   Calcium 8.1 (L) 8.9 - 10.3 mg/dL   Total Protein 5.8 (L) 6.5 - 8.1 g/dL   Albumin 3.0 (L) 3.5 - 5.0 g/dL   AST 31 15 - 41 U/L   ALT 17 14 - 54 U/L   Alkaline Phosphatase 85 38 - 126 U/L   Total Bilirubin 0.6 0.3 - 1.2 mg/dL   GFR calc non Af Amer >60 >60 mL/min   GFR calc Af Amer >60 >60 mL/min    Comment: (NOTE) The eGFR has been calculated using the CKD EPI equation. This calculation has not been validated in all clinical situations. eGFR's persistently <60 mL/min signify possible Chronic Kidney Disease.    Anion gap 8 5 - 15  MRSA PCR Screening     Status: None   Collection Time: 05/02/16  6:08 PM  Result Value Ref Range   MRSA by PCR NEGATIVE NEGATIVE    Comment:        The GeneXpert MRSA Assay (FDA approved for NASAL specimens only), is one component of a comprehensive MRSA colonization surveillance program. It is not intended to diagnose MRSA infection nor to guide or monitor treatment for MRSA infections.      Current Facility-Administered Medications  Medication Dose Route Frequency Provider Last Rate Last Dose  . acetaminophen (TYLENOL) tablet 650 mg  650 mg Oral Q6H PRN Mir Marry Guan, MD       Or  . acetaminophen (TYLENOL) suppository 650 mg  650 mg Rectal Q6H PRN Mir Marry Guan, MD      . amLODipine (NORVASC) tablet 2.5 mg  2.5 mg Oral Daily Davonna Belling, MD   2.5 mg at 05/03/16 1206  . amoxicillin (AMOXIL) capsule 500 mg  500 mg Oral Q8H Debbe Odea, MD   500 mg  at 05/03/16 1409  . aspirin chewable tablet 81 mg  81 mg Oral Daily Davonna Belling, MD   81 mg at 05/03/16 1207  . enoxaparin (LOVENOX) injection 40 mg  40 mg Subcutaneous Q24H Mir Marry Guan, MD   40 mg at 05/02/16 2202  . losartan (COZAAR) tablet 100 mg  100 mg Oral Daily Davonna Belling, MD   100 mg at 05/03/16 1206   And  . hydrochlorothiazide (HYDRODIURIL) tablet 25 mg  25 mg Oral Daily Davonna Belling, MD   25 mg at 05/03/16 1207  . ondansetron (ZOFRAN) tablet 4 mg  4 mg Oral Q6H PRN Mir Marry Guan, MD       Or  . ondansetron [facility]) injection 4 mg  4 mg Intravenous Q6H PRN Mir Marry Guan, MD      . potassium chloride SA (K-DUR,KLOR-CON) CR tablet 20 mEq  20 mEq Oral Daily Davonna Belling, MD   20 mEq at 05/03/16 1206  . [START ON 05/04/2016] QUEtiapine (SEROQUEL) tablet 50 mg  50 mg Oral QHS Gaetan Spieker, MD      . senna-docusate (Senokot-S) tablet 1 tablet  1 tablet Oral QHS PRN Mir Marry Guan, MD      . sertraline (ZOLOFT) tablet 100 mg  100 mg Oral Daily Davonna Belling, MD   100 mg at 05/03/16 1207  . simvastatin (ZOCOR) tablet 20 mg  20 mg Oral QPM Davonna Belling, MD   20 mg at 05/02/16 1802  . sodium chloride 0.9 % bolus 500 mL  500 mL Intravenous Once Harvel Quale, MD   500 mL at 05/01/16 6333    Musculoskeletal: Strength & Muscle Tone: within normal limits Gait & Station: normal Patient leans: N/A  Psychiatric Specialty Exam: Physical  Exam  Constitutional: She is oriented to person, place, and time.  Neck: Normal range of motion.  Musculoskeletal: Normal range of motion.  Neurological: She is alert and oriented to person, place, and time.  Skin: Skin is warm and dry.  Psychiatric: Her speech is normal. Her mood appears anxious. She is not agitated and not withdrawn. Thought content is not paranoid and not delusional. She expresses impulsivity. She expresses no homicidal and no suicidal ideation.    Review of Systems  Psychiatric/Behavioral: The patient is nervous/anxious (Stable) and has insomnia (Improved).   All other systems reviewed and are negative.   Blood pressure 172/95, pulse 102, temperature 98.6 F (37 C), temperature source Oral, resp. rate 20, height 5' 1"  (1.549 m), weight 81.5 kg (179 lb 10.8 oz), SpO2 97 %.Body mass index is 33.97 kg/(m^2).  General Appearance: Casual  Eye Contact:  Good  Speech:  Clear and Coherent and Normal Rate  Volume:  Normal  Mood:  Anxious  Affect:  Appropriate  Thought Process:  Coherent and Goal Directed  Orientation:  Full (Time, Place, and Person)  Thought Content:  Delusions, Hallucinations: Auditory, delusions, and paranoia  Suicidal Thoughts:  No  Homicidal Thoughts:  No  Memory:  Immediate;   Fair Recent;   Fair Remote;   Fair  Judgement:  Impaired  Insight:  Lacking  Psychomotor Activity:  Increased  Concentration:  Concentration: Poor and Attention Span: Poor  Recall:  Poor  Fund of Knowledge:  Fair  Language:  Good  Akathisia:  No  Handed:  Right  AIMS (if indicated):     Assets:  Communication Skills Desire for Improvement Housing Social Support Transportation  ADL's:  Intact  Cognition:  WNL  Sleep:   poor  Treatment Plan Summary: -Crisis stabilization -Daily contact with patient to assess and evaluate symptoms, progress in treatment and Medication management  -Change Seroquel to 50 mg Qhs for psychosis/insomnia -Continues Sertraline 100  mg po daily for anxiety/depression   Disposition: Recommend psychiatric Inpatient admission when medically cleared. Supportive therapy provided about ongoing stressors. Patient will benefit from inpatient admission to [facility] for stabilization  Corena Pilgrim, MD 05/03/2016 2:36 PM

## 2016-05-03 NOTE — Progress Notes (Signed)
Patient is very agitated. Calling out,crying, moving all extremities, pulling off the electrodes and telemetry as well as her gown. PCP was notified.

## 2016-05-04 DIAGNOSIS — M79621 Pain in right upper arm: Secondary | ICD-10-CM | POA: Diagnosis not present

## 2016-05-04 DIAGNOSIS — D649 Anemia, unspecified: Secondary | ICD-10-CM | POA: Diagnosis not present

## 2016-05-04 DIAGNOSIS — R000 Tachycardia, unspecified: Secondary | ICD-10-CM | POA: Diagnosis not present

## 2016-05-04 DIAGNOSIS — I083 Combined rheumatic disorders of mitral, aortic and tricuspid valves: Secondary | ICD-10-CM | POA: Diagnosis not present

## 2016-05-04 DIAGNOSIS — A419 Sepsis, unspecified organism: Secondary | ICD-10-CM | POA: Diagnosis not present

## 2016-05-04 DIAGNOSIS — F3112 Bipolar disorder, current episode manic without psychotic features, moderate: Secondary | ICD-10-CM | POA: Diagnosis not present

## 2016-05-04 DIAGNOSIS — F315 Bipolar disorder, current episode depressed, severe, with psychotic features: Secondary | ICD-10-CM | POA: Diagnosis not present

## 2016-05-04 DIAGNOSIS — I959 Hypotension, unspecified: Secondary | ICD-10-CM | POA: Diagnosis not present

## 2016-05-04 DIAGNOSIS — E0500 Thyrotoxicosis with diffuse goiter without thyrotoxic crisis or storm: Secondary | ICD-10-CM | POA: Diagnosis not present

## 2016-05-04 DIAGNOSIS — I10 Essential (primary) hypertension: Secondary | ICD-10-CM | POA: Diagnosis not present

## 2016-05-04 DIAGNOSIS — M7541 Impingement syndrome of right shoulder: Secondary | ICD-10-CM | POA: Diagnosis not present

## 2016-05-04 DIAGNOSIS — E559 Vitamin D deficiency, unspecified: Secondary | ICD-10-CM | POA: Diagnosis not present

## 2016-05-04 DIAGNOSIS — D696 Thrombocytopenia, unspecified: Secondary | ICD-10-CM | POA: Diagnosis not present

## 2016-05-04 DIAGNOSIS — E860 Dehydration: Secondary | ICD-10-CM | POA: Diagnosis not present

## 2016-05-04 DIAGNOSIS — N390 Urinary tract infection, site not specified: Secondary | ICD-10-CM | POA: Diagnosis not present

## 2016-05-04 DIAGNOSIS — N179 Acute kidney failure, unspecified: Secondary | ICD-10-CM | POA: Diagnosis not present

## 2016-05-04 DIAGNOSIS — M25511 Pain in right shoulder: Secondary | ICD-10-CM | POA: Diagnosis not present

## 2016-05-04 DIAGNOSIS — N3000 Acute cystitis without hematuria: Secondary | ICD-10-CM | POA: Diagnosis not present

## 2016-05-04 LAB — BASIC METABOLIC PANEL
ANION GAP: 16 — AB (ref 5–15)
BUN: 12 mg/dL (ref 6–20)
CALCIUM: 9.3 mg/dL (ref 8.9–10.3)
CO2: 23 mmol/L (ref 22–32)
Chloride: 99 mmol/L — ABNORMAL LOW (ref 101–111)
Creatinine, Ser: 0.77 mg/dL (ref 0.44–1.00)
GFR calc Af Amer: >60 mL/min (ref >60)
GFR calc non Af Amer: >60 mL/min (ref >60)
GLUCOSE: 121 mg/dL — AB (ref 65–99)
POTASSIUM: 3.9 mmol/L (ref 3.5–5.1)
Sodium: 138 mmol/L (ref 135–145)

## 2016-05-04 MED ORDER — LORAZEPAM 2 MG/ML IJ SOLN
0.5000 mg | Freq: Once | INTRAMUSCULAR | Status: AC
  Administered 2016-05-04: 0.5 mg via INTRAVENOUS
  Filled 2016-05-04: qty 1

## 2016-05-04 NOTE — Clinical Social Work Note (Signed)
CSW spoke with [facility] who stated that patient has been accepted and came come this morning.  CSW faxed IVC paperwork to [facility] and provided RN with information.  CSW will obtain accepting MD name and provide to RN  .Dede Query, LCSW [facility] Clinical Social Worker - Weekend Coverage cell #: [phone]

## 2016-05-04 NOTE — Clinical Social Work Note (Signed)
CSW spoke with [facility] Nearing who provided accepting doctor's name: Dr. Morrell Riddle.  RN can call report at [phone]. Number provided to patient's RN  .Dede Query, LCSW [facility] Clinical Social Worker - Weekend Coverage cell #: [phone]

## 2016-05-06 LAB — CULTURE, BLOOD (ROUTINE X 2)
CULTURE: NO GROWTH
CULTURE: NO GROWTH

## 2016-05-20 DIAGNOSIS — G4700 Insomnia, unspecified: Secondary | ICD-10-CM | POA: Diagnosis not present

## 2016-05-20 DIAGNOSIS — I10 Essential (primary) hypertension: Secondary | ICD-10-CM | POA: Diagnosis not present

## 2016-05-20 DIAGNOSIS — F3170 Bipolar disorder, currently in remission, most recent episode unspecified: Secondary | ICD-10-CM | POA: Diagnosis not present

## 2016-05-20 DIAGNOSIS — E785 Hyperlipidemia, unspecified: Secondary | ICD-10-CM | POA: Diagnosis not present

## 2016-05-20 DIAGNOSIS — E559 Vitamin D deficiency, unspecified: Secondary | ICD-10-CM | POA: Diagnosis not present

## 2016-05-23 DIAGNOSIS — F0634 Mood disorder due to known physiological condition with mixed features: Secondary | ICD-10-CM | POA: Diagnosis not present

## 2016-06-06 DIAGNOSIS — G40209 Localization-related (focal) (partial) symptomatic epilepsy and epileptic syndromes with complex partial seizures, not intractable, without status epilepticus: Secondary | ICD-10-CM | POA: Diagnosis not present

## 2016-06-06 DIAGNOSIS — I10 Essential (primary) hypertension: Secondary | ICD-10-CM | POA: Diagnosis not present

## 2016-06-06 DIAGNOSIS — G4700 Insomnia, unspecified: Secondary | ICD-10-CM | POA: Diagnosis not present

## 2016-06-06 DIAGNOSIS — F3170 Bipolar disorder, currently in remission, most recent episode unspecified: Secondary | ICD-10-CM | POA: Diagnosis not present

## 2016-06-06 DIAGNOSIS — E7800 Pure hypercholesterolemia, unspecified: Secondary | ICD-10-CM | POA: Diagnosis not present

## 2016-06-24 DIAGNOSIS — Z1231 Encounter for screening mammogram for malignant neoplasm of breast: Secondary | ICD-10-CM | POA: Diagnosis not present

## 2016-07-03 DIAGNOSIS — F0634 Mood disorder due to known physiological condition with mixed features: Secondary | ICD-10-CM | POA: Diagnosis not present

## 2016-07-08 DIAGNOSIS — Z79899 Other long term (current) drug therapy: Secondary | ICD-10-CM | POA: Diagnosis not present

## 2016-07-30 DIAGNOSIS — R946 Abnormal results of thyroid function studies: Secondary | ICD-10-CM | POA: Diagnosis not present

## 2016-08-04 ENCOUNTER — Ambulatory Visit: Payer: Commercial Managed Care - HMO | Admitting: Neurology

## 2016-08-06 ENCOUNTER — Encounter: Payer: Self-pay | Admitting: Neurology

## 2016-08-06 ENCOUNTER — Ambulatory Visit (INDEPENDENT_AMBULATORY_CARE_PROVIDER_SITE_OTHER): Payer: Commercial Managed Care - HMO | Admitting: Neurology

## 2016-08-06 VITALS — BP 118/78 | HR 85 | Temp 97.8°F | Ht 61.0 in | Wt 200.2 lb

## 2016-08-06 DIAGNOSIS — F23 Brief psychotic disorder: Secondary | ICD-10-CM
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
## 2016-08-06 NOTE — Patient Instructions (Signed)
1. Schedule 48-hour EEG 2. Continue all your medications 3. Would hold off on driving for now until we finish the EEG 4. Follow-up in 2 months, call for any changes

## 2016-08-07 NOTE — Addendum Note (Signed)
Addended by: Feliberto Gottron on: 08/07/2016 11:05 AM   Modules accepted: Orders

## 2016-08-18 ENCOUNTER — Other Ambulatory Visit: Payer: Self-pay

## 2016-08-22 DIAGNOSIS — R0602 Shortness of breath: Secondary | ICD-10-CM | POA: Diagnosis not present

## 2016-08-22 DIAGNOSIS — Z6834 Body mass index (BMI) 34.0-34.9, adult: Secondary | ICD-10-CM | POA: Diagnosis not present

## 2016-08-22 DIAGNOSIS — R635 Abnormal weight gain: Secondary | ICD-10-CM | POA: Diagnosis not present

## 2016-08-22 DIAGNOSIS — R011 Cardiac murmur, unspecified: Secondary | ICD-10-CM | POA: Diagnosis not present

## 2016-09-10 ENCOUNTER — Other Ambulatory Visit: Payer: Self-pay | Admitting: Family Medicine

## 2016-09-10 DIAGNOSIS — R011 Cardiac murmur, unspecified: Secondary | ICD-10-CM

## 2016-09-29 ENCOUNTER — Other Ambulatory Visit: Payer: Self-pay | Admitting: Family Medicine

## 2016-09-29 DIAGNOSIS — R011 Cardiac murmur, unspecified: Secondary | ICD-10-CM

## 2016-10-02 DIAGNOSIS — F0634 Mood disorder due to known physiological condition with mixed features: Secondary | ICD-10-CM | POA: Diagnosis not present

## 2016-10-21 ENCOUNTER — Other Ambulatory Visit: Payer: Self-pay

## 2016-10-21 ENCOUNTER — Ambulatory Visit (HOSPITAL_COMMUNITY): Payer: Commercial Managed Care - HMO | Attending: Cardiovascular Disease

## 2016-10-21 DIAGNOSIS — E785 Hyperlipidemia, unspecified: Secondary | ICD-10-CM | POA: Insufficient documentation

## 2016-10-21 DIAGNOSIS — I082 Rheumatic disorders of both aortic and tricuspid valves: Secondary | ICD-10-CM | POA: Diagnosis not present

## 2016-10-21 DIAGNOSIS — I10 Essential (primary) hypertension: Secondary | ICD-10-CM | POA: Diagnosis not present

## 2016-10-21 DIAGNOSIS — R011 Cardiac murmur, unspecified: Secondary | ICD-10-CM | POA: Diagnosis not present

## 2016-11-17 ENCOUNTER — Ambulatory Visit: Payer: Self-pay | Admitting: Neurology

## 2016-12-08 DIAGNOSIS — F419 Anxiety disorder, unspecified: Secondary | ICD-10-CM | POA: Diagnosis not present

## 2016-12-08 DIAGNOSIS — G4700 Insomnia, unspecified: Secondary | ICD-10-CM | POA: Diagnosis not present

## 2016-12-08 DIAGNOSIS — I10 Essential (primary) hypertension: Secondary | ICD-10-CM | POA: Diagnosis not present

## 2016-12-08 DIAGNOSIS — E7800 Pure hypercholesterolemia, unspecified: Secondary | ICD-10-CM | POA: Diagnosis not present

## 2016-12-08 DIAGNOSIS — F3170 Bipolar disorder, currently in remission, most recent episode unspecified: Secondary | ICD-10-CM | POA: Diagnosis not present

## 2017-01-01 DIAGNOSIS — F0634 Mood disorder due to known physiological condition with mixed features: Secondary | ICD-10-CM | POA: Diagnosis not present

## 2017-02-16 ENCOUNTER — Encounter: Payer: Self-pay | Admitting: Gastroenterology

## 2017-04-07 DIAGNOSIS — F0634 Mood disorder due to known physiological condition with mixed features: Secondary | ICD-10-CM | POA: Diagnosis not present

## 2017-06-17 DIAGNOSIS — M791 Myalgia: Secondary | ICD-10-CM | POA: Diagnosis not present

## 2017-06-17 DIAGNOSIS — I10 Essential (primary) hypertension: Secondary | ICD-10-CM | POA: Diagnosis not present

## 2017-06-17 DIAGNOSIS — F3170 Bipolar disorder, currently in remission, most recent episode unspecified: Secondary | ICD-10-CM | POA: Diagnosis not present

## 2017-06-17 DIAGNOSIS — E7800 Pure hypercholesterolemia, unspecified: Secondary | ICD-10-CM | POA: Diagnosis not present

## 2017-06-17 DIAGNOSIS — G4700 Insomnia, unspecified: Secondary | ICD-10-CM | POA: Diagnosis not present

## 2017-08-05 DIAGNOSIS — F0634 Mood disorder due to known physiological condition with mixed features: Secondary | ICD-10-CM | POA: Diagnosis not present

## 2017-08-05 DIAGNOSIS — F259 Schizoaffective disorder, unspecified: Secondary | ICD-10-CM | POA: Diagnosis not present

## 2017-08-12 DIAGNOSIS — E039 Hypothyroidism, unspecified: Secondary | ICD-10-CM | POA: Diagnosis not present

## 2017-09-23 DIAGNOSIS — F259 Schizoaffective disorder, unspecified: Secondary | ICD-10-CM | POA: Diagnosis not present

## 2017-09-23 DIAGNOSIS — F0634 Mood disorder due to known physiological condition with mixed features: Secondary | ICD-10-CM | POA: Diagnosis not present

## 2017-10-16 DIAGNOSIS — F314 Bipolar disorder, current episode depressed, severe, without psychotic features: Secondary | ICD-10-CM | POA: Diagnosis not present

## 2017-10-16 DIAGNOSIS — F259 Schizoaffective disorder, unspecified: Secondary | ICD-10-CM | POA: Diagnosis not present

## 2017-10-16 DIAGNOSIS — F0634 Mood disorder due to known physiological condition with mixed features: Secondary | ICD-10-CM | POA: Diagnosis not present

## 2017-10-21 IMAGING — CR DG CHEST 2V
2 series · 2 of 2 positions shown · non-contrast
Comparison: 07/02/2014

CLINICAL DATA: Medical clearance for [REDACTED]. History of
hypertension

EXAM:
CHEST  2 VIEW

[w chest pa]
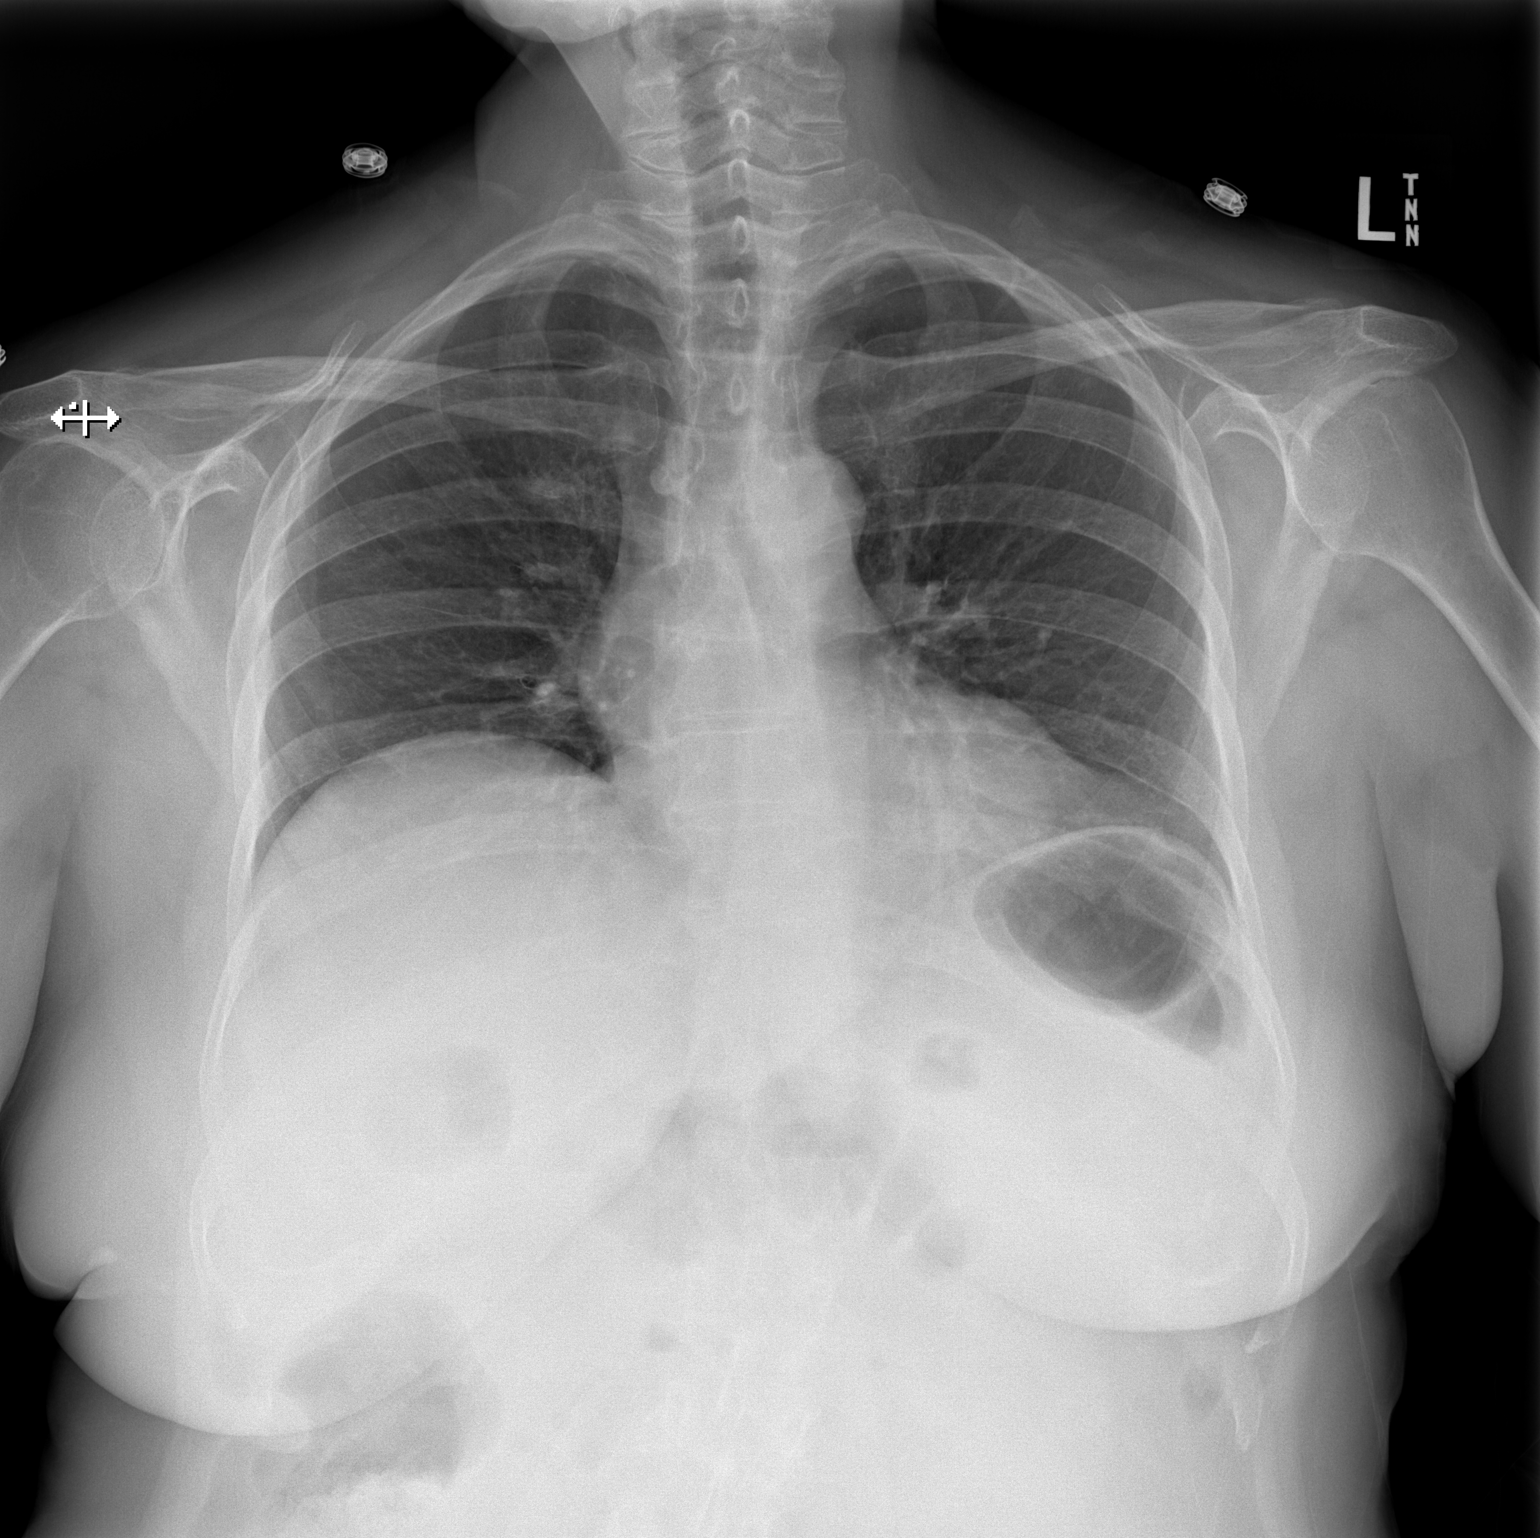

[w chest lat]
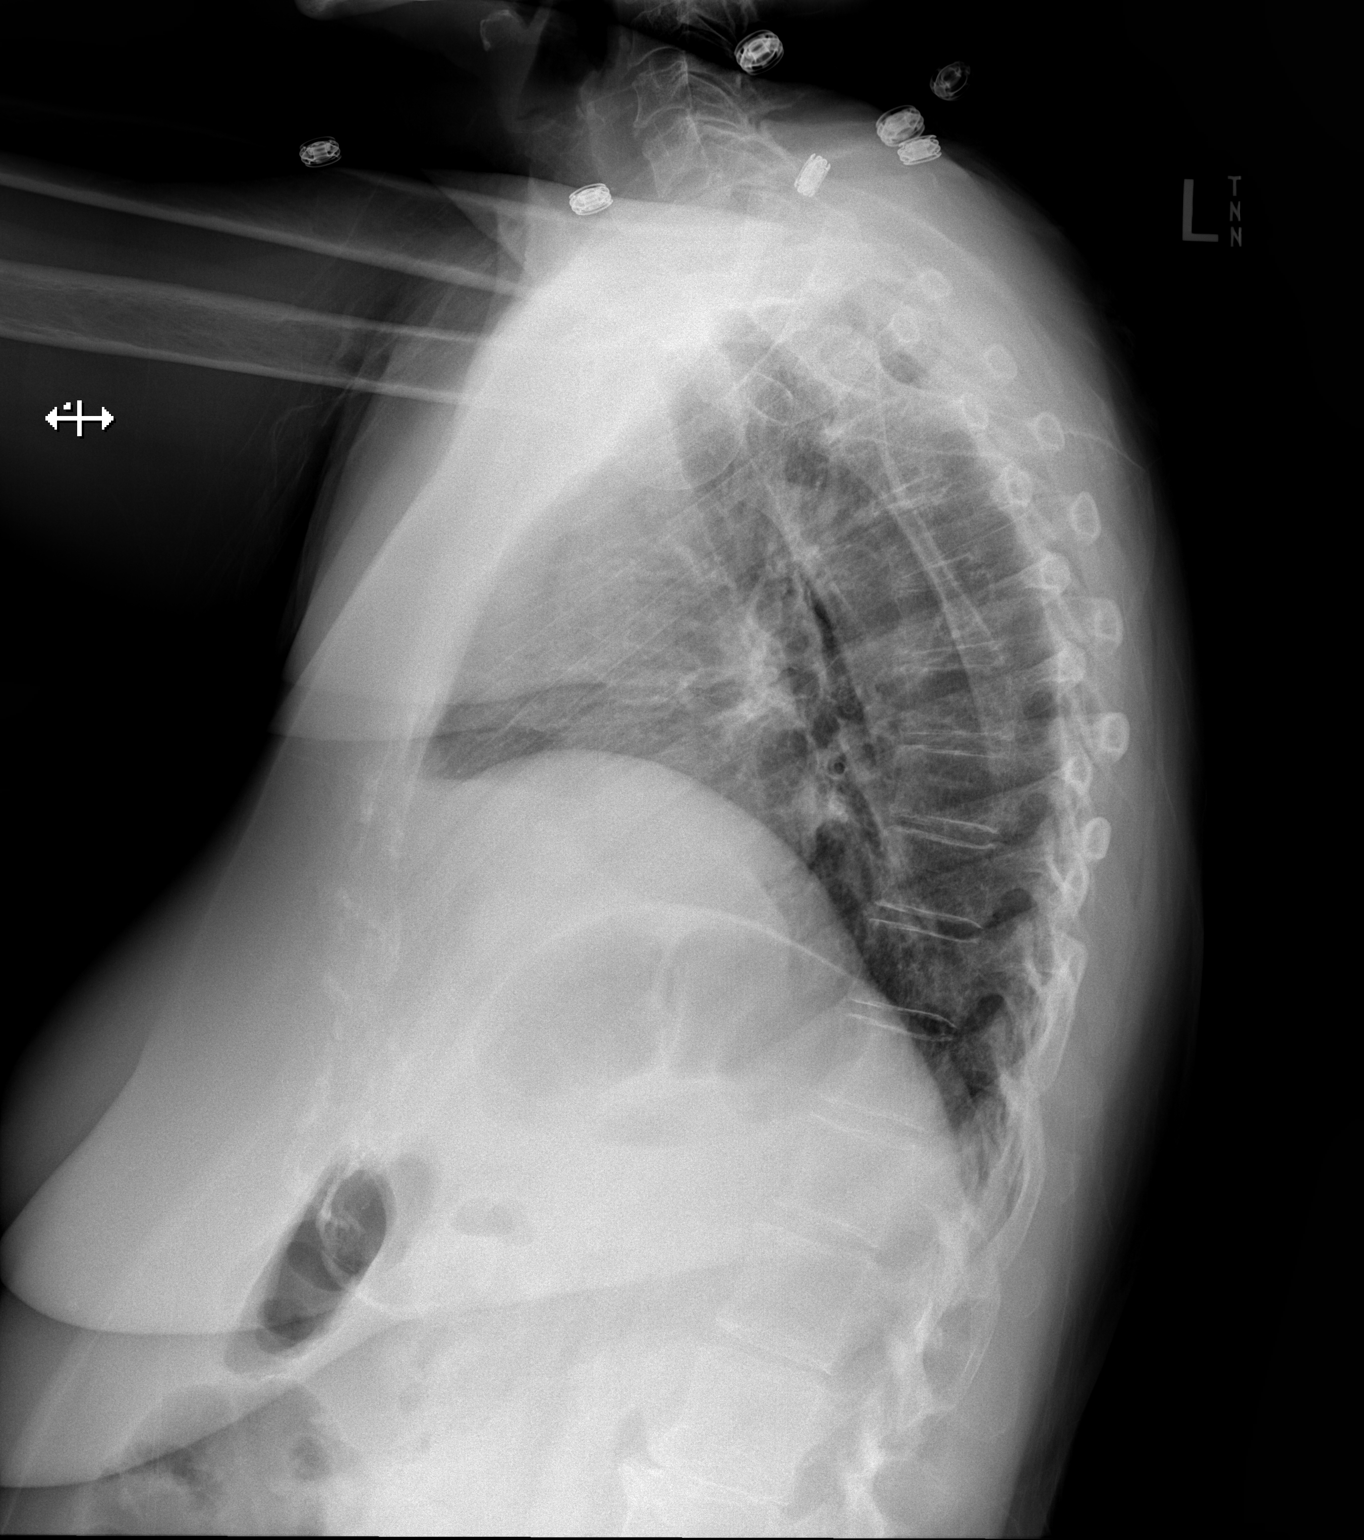

[2 of 2 positions shown; findings below may reference images not displayed]

FINDINGS: Low lung volumes. Heart and mediastinal contours are within normal
limits. No focal opacities or effusions. No acute bony abnormality.
IMPRESSION: No active cardiopulmonary disease.

## 2017-10-22 IMAGING — CR DG CHEST 2V
2 series · 2 of 2 positions shown · non-contrast
Comparison: 04/22/2016

CLINICAL DATA: Altered mental status, fever, code sepsis

EXAM:
CHEST  2 VIEW

[x chest ap]
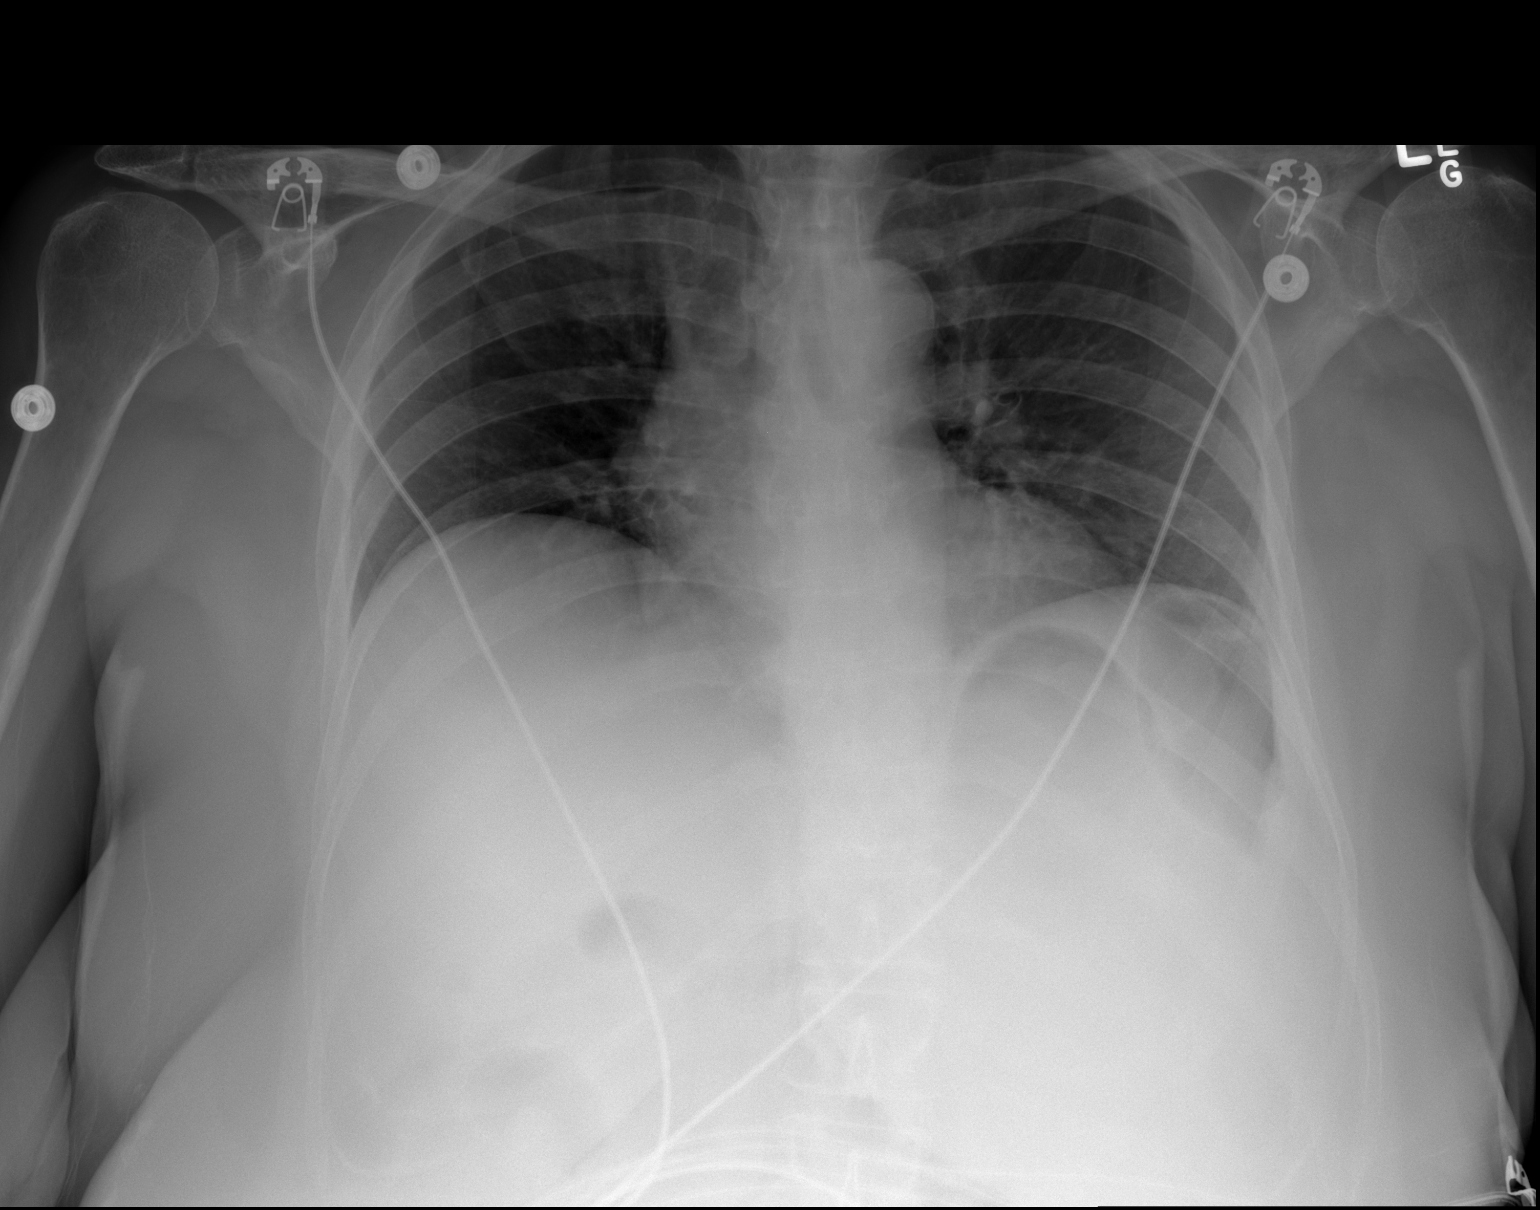

[w chest lat]
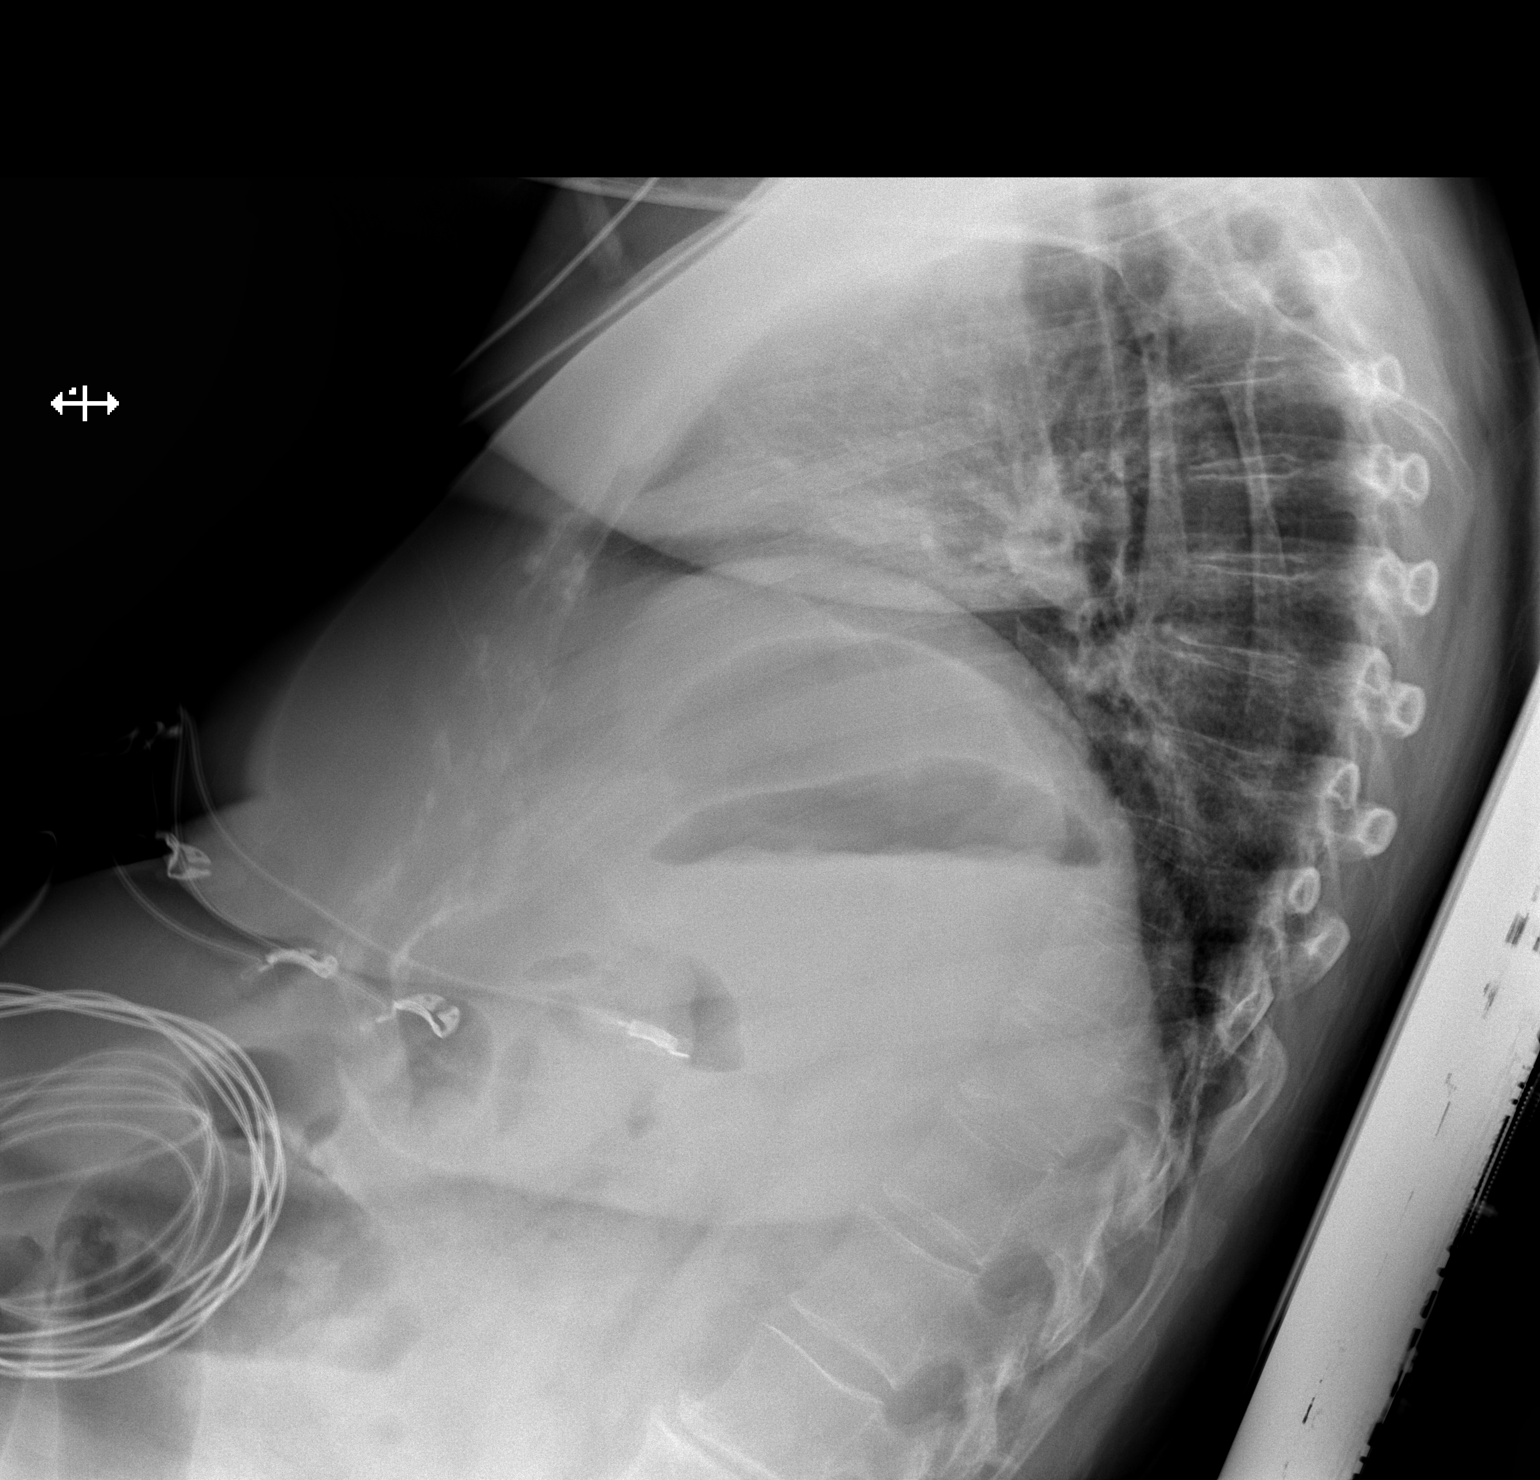

[2 of 2 positions shown; findings below may reference images not displayed]

FINDINGS: Cardiomediastinal silhouette is stable. No acute infiltrate or
pleural effusion. No pulmonary edema. Bony thorax is unremarkable.
IMPRESSION: No active cardiopulmonary disease.

## 2017-12-02 DIAGNOSIS — F259 Schizoaffective disorder, unspecified: Secondary | ICD-10-CM | POA: Diagnosis not present

## 2017-12-02 DIAGNOSIS — F0634 Mood disorder due to known physiological condition with mixed features: Secondary | ICD-10-CM | POA: Diagnosis not present

## 2017-12-02 DIAGNOSIS — F314 Bipolar disorder, current episode depressed, severe, without psychotic features: Secondary | ICD-10-CM | POA: Diagnosis not present

## 2017-12-02 DIAGNOSIS — F419 Anxiety disorder, unspecified: Secondary | ICD-10-CM | POA: Diagnosis not present

## 2017-12-18 DIAGNOSIS — E7800 Pure hypercholesterolemia, unspecified: Secondary | ICD-10-CM | POA: Diagnosis not present

## 2017-12-18 DIAGNOSIS — F3170 Bipolar disorder, currently in remission, most recent episode unspecified: Secondary | ICD-10-CM | POA: Diagnosis not present

## 2017-12-18 DIAGNOSIS — G4700 Insomnia, unspecified: Secondary | ICD-10-CM | POA: Diagnosis not present

## 2017-12-18 DIAGNOSIS — E039 Hypothyroidism, unspecified: Secondary | ICD-10-CM | POA: Diagnosis not present

## 2017-12-18 DIAGNOSIS — I10 Essential (primary) hypertension: Secondary | ICD-10-CM | POA: Diagnosis not present

## 2018-01-28 DIAGNOSIS — F314 Bipolar disorder, current episode depressed, severe, without psychotic features: Secondary | ICD-10-CM | POA: Diagnosis not present

## 2018-01-28 DIAGNOSIS — F259 Schizoaffective disorder, unspecified: Secondary | ICD-10-CM | POA: Diagnosis not present

## 2018-01-28 DIAGNOSIS — F0634 Mood disorder due to known physiological condition with mixed features: Secondary | ICD-10-CM | POA: Diagnosis not present

## 2018-04-01 DIAGNOSIS — F259 Schizoaffective disorder, unspecified: Secondary | ICD-10-CM | POA: Diagnosis not present

## 2018-04-01 DIAGNOSIS — F0634 Mood disorder due to known physiological condition with mixed features: Secondary | ICD-10-CM | POA: Diagnosis not present

## 2018-06-03 DIAGNOSIS — F259 Schizoaffective disorder, unspecified: Secondary | ICD-10-CM | POA: Diagnosis not present

## 2018-06-03 DIAGNOSIS — F0634 Mood disorder due to known physiological condition with mixed features: Secondary | ICD-10-CM | POA: Diagnosis not present

## 2018-06-21 DIAGNOSIS — F3170 Bipolar disorder, currently in remission, most recent episode unspecified: Secondary | ICD-10-CM | POA: Diagnosis not present

## 2018-06-21 DIAGNOSIS — I10 Essential (primary) hypertension: Secondary | ICD-10-CM | POA: Diagnosis not present

## 2018-06-21 DIAGNOSIS — E7800 Pure hypercholesterolemia, unspecified: Secondary | ICD-10-CM | POA: Diagnosis not present

## 2018-06-21 DIAGNOSIS — Z1239 Encounter for other screening for malignant neoplasm of breast: Secondary | ICD-10-CM | POA: Diagnosis not present

## 2018-06-21 DIAGNOSIS — Z1211 Encounter for screening for malignant neoplasm of colon: Secondary | ICD-10-CM | POA: Diagnosis not present

## 2018-06-23 ENCOUNTER — Other Ambulatory Visit (HOSPITAL_COMMUNITY): Payer: Self-pay | Admitting: Psychiatry

## 2018-06-28 ENCOUNTER — Encounter: Payer: Self-pay | Admitting: Gastroenterology

## 2018-07-08 DIAGNOSIS — R739 Hyperglycemia, unspecified: Secondary | ICD-10-CM | POA: Diagnosis not present

## 2018-07-09 ENCOUNTER — Other Ambulatory Visit (HOSPITAL_COMMUNITY): Payer: Self-pay | Admitting: Psychiatry

## 2018-07-26 DIAGNOSIS — F259 Schizoaffective disorder, unspecified: Secondary | ICD-10-CM | POA: Diagnosis not present

## 2018-07-26 DIAGNOSIS — F0634 Mood disorder due to known physiological condition with mixed features: Secondary | ICD-10-CM | POA: Diagnosis not present

## 2018-08-12 ENCOUNTER — Encounter: Payer: Self-pay | Admitting: Gastroenterology

## 2018-08-12 ENCOUNTER — Ambulatory Visit (AMBULATORY_SURGERY_CENTER): Payer: Self-pay | Admitting: *Deleted

## 2018-08-12 VITALS — Ht 65.0 in | Wt 175.0 lb

## 2018-08-12 DIAGNOSIS — Z86010 Personal history of colonic polyps: Secondary | ICD-10-CM

## 2018-08-12 MED ORDER — NA SULFATE-K SULFATE-MG SULF 17.5-3.13-1.6 GM/177ML PO SOLN
ORAL | 0 refills | Status: DC
Start: 2018-08-12 — End: 2018-08-26

## 2018-08-12 NOTE — Progress Notes (Signed)
Patient denies any allergies to eggs or soy. Patient denies any problems with anesthesia/sedation. Patient denies any oxygen use at home. Patient denies taking any diet/weight loss medications or blood thinners. EMMI education offered, pt declined.  

## 2018-08-26 ENCOUNTER — Ambulatory Visit (AMBULATORY_SURGERY_CENTER): Payer: Medicare HMO | Admitting: Gastroenterology

## 2018-08-26 ENCOUNTER — Encounter: Payer: Self-pay | Admitting: Gastroenterology

## 2018-08-26 VITALS — BP 125/67 | HR 72 | Temp 97.7°F | Resp 13 | Ht 65.0 in | Wt 175.0 lb

## 2018-08-26 DIAGNOSIS — I10 Essential (primary) hypertension: Secondary | ICD-10-CM | POA: Diagnosis not present

## 2018-08-26 DIAGNOSIS — Z86010 Personal history of colonic polyps: Secondary | ICD-10-CM

## 2018-08-26 DIAGNOSIS — D122 Benign neoplasm of ascending colon: Secondary | ICD-10-CM

## 2018-08-26 DIAGNOSIS — D124 Benign neoplasm of descending colon: Secondary | ICD-10-CM | POA: Diagnosis not present

## 2018-08-26 DIAGNOSIS — D125 Benign neoplasm of sigmoid colon: Secondary | ICD-10-CM | POA: Diagnosis not present

## 2018-08-26 DIAGNOSIS — D123 Benign neoplasm of transverse colon: Secondary | ICD-10-CM

## 2018-08-26 MED ORDER — SODIUM CHLORIDE 0.9 % IV SOLN: 500.0000 mL | Freq: Once | INTRAVENOUS | Status: DC

## 2018-08-26 NOTE — Op Note (Signed)
[facility] Patient Name: Heather Wilson Procedure Date: 08/26/2018 7:53 AM MRN: [id number] Endoscopist: Ladene Artist , MD Age: 72 Referring MD:  Date of Birth: 19-Jun-1946 Gender: Female Account #: [id number] Procedure:                Colonoscopy Indications:              Surveillance: Personal history of adenomatous                            polyps on last colonoscopy > 5 years ago Medicines:                Monitored Anesthesia Care Procedure:                Pre-Anesthesia Assessment:                           - Prior to the procedure, a History and Physical                            was performed, and patient medications and                            allergies were reviewed. The patient's tolerance of                            previous anesthesia was also reviewed. The risks                            and benefits of the procedure and the sedation                            options and risks were discussed with the patient.                            All questions were answered, and informed consent                            was obtained. Prior Anticoagulants: The patient has                            taken no previous anticoagulant or antiplatelet                            agents. ASA Grade Assessment: II - A patient with                            mild systemic disease. After reviewing the risks                            and benefits, the patient was deemed in                            satisfactory condition to undergo the procedure.  After obtaining informed consent, the colonoscope                            was passed under direct vision. Throughout the                            procedure, the patient's blood pressure, pulse, and                            oxygen saturations were monitored continuously. The                            Colonoscope was introduced through the anus and                            advanced to the the  cecum, identified by                            appendiceal orifice and ileocecal valve. The                            ileocecal valve, appendiceal orifice, and rectum                            were photographed. The quality of the bowel                            preparation was good. The colonoscopy was performed                            without difficulty. The patient tolerated the                            procedure well. Scope In: 8:05:54 AM Scope Out: 8:22:02 AM Scope Withdrawal Time: 0 hours 13 minutes 36 seconds  Total Procedure Duration: 0 hours 16 minutes 8 seconds  Findings:                 The perianal and digital rectal examinations were                            normal.                           Three sessile polyps were found in the sigmoid                            colon, descending colon and ascending colon. The                            polyps were 6 to 9 mm in size. These polyps were                            removed with a cold snare. Resection and retrieval  were complete.                           A 4 mm polyp was found in the transverse colon. The                            polyp was sessile. The polyp was removed with a                            cold biopsy forceps. Resection and retrieval were                            complete.                           Many medium-mouthed diverticula were found in the                            left colon.                           The exam was otherwise without abnormality on                            direct and retroflexion views. Complications:            No immediate complications. Estimated blood loss:                            None. Estimated Blood Loss:     Estimated blood loss: none. Impression:               - Three 6 to 9 mm polyps in the sigmoid colon, in                            the descending colon and in the ascending colon,                            removed with a cold  snare. Resected and retrieved.                           - One 4 mm polyp in the transverse colon, removed                            with a cold biopsy forceps. Resected and retrieved.                           - Diverticulosis in the left colon.                           - The examination was otherwise normal on direct                            and retroflexion views. Recommendation:           - Repeat colonoscopy in  3 - 5 years for                            surveillance pending pathology review.                           - Patient has a contact number available for                            emergencies. The signs and symptoms of potential                            delayed complications were discussed with the                            patient. Return to normal activities tomorrow.                            Written discharge instructions were provided to the                            patient.                           - Resume previous diet.                           - Continue present medications.                           - Await pathology results. Ladene Artist, MD 08/26/2018 8:28:01 AM This report has been signed electronically.

## 2018-08-26 NOTE — Progress Notes (Signed)
Called to room to assist during endoscopic procedure.  Patient ID and intended procedure confirmed with present staff. Received instructions for my participation in the procedure from the performing physician.  

## 2018-08-26 NOTE — Progress Notes (Signed)
Pt's states no medical or surgical changes since previsit or office visit. 

## 2018-08-26 NOTE — Progress Notes (Signed)
Report given to PACU, vss 

## 2018-08-26 NOTE — Patient Instructions (Signed)
YOU HAD AN ENDOSCOPIC PROCEDURE TODAY AT THE [facility]:   Refer to the procedure report that was given to you for any specific questions about what was found during the examination.  If the procedure report does not answer your questions, please call your gastroenterologist to clarify.  If you requested that your care partner not be given the details of your procedure findings, then the procedure report has been included in a sealed envelope for you to review at your convenience later.  YOU SHOULD EXPECT: Some feelings of bloating in the abdomen. Passage of more gas than usual.  Walking can help get rid of the air that was put into your GI tract during the procedure and reduce the bloating. If you had a lower endoscopy (such as a colonoscopy or flexible sigmoidoscopy) you may notice spotting of blood in your stool or on the toilet paper. If you underwent a bowel prep for your procedure, you may not have a normal bowel movement for a few days.  Please Note:  You might notice some irritation and congestion in your nose or some drainage.  This is from the oxygen used during your procedure.  There is no need for concern and it should clear up in a day or so.  SYMPTOMS TO REPORT IMMEDIATELY:   Following lower endoscopy (colonoscopy or flexible sigmoidoscopy):  Excessive amounts of blood in the stool  Significant tenderness or worsening of abdominal pains  Swelling of the abdomen that is new, acute  Fever of 100F or higher   For urgent or emergent issues, a gastroenterologist can be reached at any hour by calling [phone].   DIET:  We do recommend a small meal at first, but then you may proceed to your regular diet.  Drink plenty of fluids but you should avoid alcoholic beverages for 24 hours. Try to increase the fiber in your diet, and drink plenty of water.  ACTIVITY:  You should plan to take it easy for the rest of today and you should NOT DRIVE or use heavy machinery until  tomorrow (because of the sedation medicines used during the test).    FOLLOW UP: Our staff will call the number listed on your records the next business day following your procedure to check on you and address any questions or concerns that you may have regarding the information given to you following your procedure. If we do not reach you, we will leave a message.  However, if you are feeling well and you are not experiencing any problems, there is no need to return our call.  We will assume that you have returned to your regular daily activities without incident.  If any biopsies were taken you will be contacted by phone or by letter within the next 1-3 weeks.  Please call us at [phone] if you have not heard about the biopsies in 3 weeks.    SIGNATURES/CONFIDENTIALITY: You and/or your care partner have signed paperwork which will be entered into your electronic medical record.  These signatures attest to the fact that that the information above on your After Visit Summary has been reviewed and is understood.  Full responsibility of the confidentiality of this discharge information lies with you and/or your care-partner.  Read all of the handouts given to you by your recovery room nurse. 

## 2018-08-27 ENCOUNTER — Telehealth: Payer: Self-pay

## 2018-08-27 NOTE — Telephone Encounter (Signed)
  Follow up Call-  Call back number 08/26/2018  Post procedure Call Back phone  # [phone]  Permission to leave phone message Yes  Some recent data might be hidden     Patient questions:  Do you have a fever, pain , or abdominal swelling? No. Pain Score  0 *  Have you tolerated food without any problems? Yes.    Have you been able to return to your normal activities? Yes.    Do you have any questions about your discharge instructions: Diet   No. Medications  No. Follow up visit  No.  Do you have questions or concerns about your Care? No.  Actions: * If pain score is 4 or above: No action needed, pain <4.

## 2018-08-31 DIAGNOSIS — R739 Hyperglycemia, unspecified: Secondary | ICD-10-CM | POA: Diagnosis not present

## 2018-09-10 ENCOUNTER — Encounter: Payer: Self-pay | Admitting: Gastroenterology

## 2018-10-11 DIAGNOSIS — F259 Schizoaffective disorder, unspecified: Secondary | ICD-10-CM | POA: Diagnosis not present

## 2018-10-11 DIAGNOSIS — F0634 Mood disorder due to known physiological condition with mixed features: Secondary | ICD-10-CM | POA: Diagnosis not present

## 2018-10-15 DIAGNOSIS — Z1231 Encounter for screening mammogram for malignant neoplasm of breast: Secondary | ICD-10-CM | POA: Diagnosis not present

## 2018-10-15 DIAGNOSIS — Z01419 Encounter for gynecological examination (general) (routine) without abnormal findings: Secondary | ICD-10-CM | POA: Diagnosis not present

## 2018-10-19 ENCOUNTER — Other Ambulatory Visit: Payer: Self-pay | Admitting: Obstetrics

## 2018-10-19 DIAGNOSIS — R928 Other abnormal and inconclusive findings on diagnostic imaging of breast: Secondary | ICD-10-CM

## 2018-10-20 ENCOUNTER — Ambulatory Visit
Admission: RE | Admit: 2018-10-20 | Discharge: 2018-10-20 | Disposition: A | Discharge status: Home / Self Care | Payer: Medicare HMO | Source: Ambulatory Visit | Attending: Obstetrics | Admitting: Obstetrics

## 2018-10-20 DIAGNOSIS — R928 Other abnormal and inconclusive findings on diagnostic imaging of breast: Secondary | ICD-10-CM | POA: Diagnosis not present

## 2018-10-20 DIAGNOSIS — N6012 Diffuse cystic mastopathy of left breast: Secondary | ICD-10-CM | POA: Diagnosis not present

## 2018-11-15 DIAGNOSIS — R634 Abnormal weight loss: Secondary | ICD-10-CM | POA: Diagnosis not present

## 2018-11-15 DIAGNOSIS — R1013 Epigastric pain: Secondary | ICD-10-CM | POA: Diagnosis not present

## 2018-11-15 DIAGNOSIS — I10 Essential (primary) hypertension: Secondary | ICD-10-CM | POA: Diagnosis not present

## 2018-11-15 DIAGNOSIS — R112 Nausea with vomiting, unspecified: Secondary | ICD-10-CM | POA: Diagnosis not present

## 2018-11-18 ENCOUNTER — Other Ambulatory Visit: Payer: Self-pay | Admitting: Family Medicine

## 2018-11-18 DIAGNOSIS — R1013 Epigastric pain: Secondary | ICD-10-CM

## 2018-11-24 ENCOUNTER — Ambulatory Visit
Admission: RE | Admit: 2018-11-24 | Discharge: 2018-11-24 | Disposition: A | Discharge status: Home / Self Care | Payer: Medicare HMO | Source: Ambulatory Visit | Attending: Family Medicine | Admitting: Family Medicine

## 2018-11-24 DIAGNOSIS — D3501 Benign neoplasm of right adrenal gland: Secondary | ICD-10-CM | POA: Diagnosis not present

## 2018-11-24 DIAGNOSIS — R1013 Epigastric pain: Secondary | ICD-10-CM

## 2018-11-24 MED ORDER — IOPAMIDOL (ISOVUE-300) INJECTION 61%
100.0000 mL | Freq: Once | INTRAVENOUS | Status: AC | PRN
  Administered 2018-11-24: 100 mL via INTRAVENOUS

## 2018-12-14 ENCOUNTER — Encounter: Payer: Self-pay | Admitting: Gastroenterology

## 2018-12-14 ENCOUNTER — Ambulatory Visit: Payer: Medicare HMO | Admitting: Gastroenterology

## 2018-12-14 VITALS — BP 130/90 | HR 88 | Ht 63.75 in | Wt 154.5 lb

## 2018-12-14 DIAGNOSIS — Z86010 Personal history of colonic polyps: Secondary | ICD-10-CM

## 2018-12-14 DIAGNOSIS — R630 Anorexia: Secondary | ICD-10-CM | POA: Diagnosis not present

## 2018-12-14 DIAGNOSIS — R634 Abnormal weight loss: Secondary | ICD-10-CM | POA: Diagnosis not present

## 2018-12-14 DIAGNOSIS — R1013 Epigastric pain: Secondary | ICD-10-CM

## 2018-12-14 MED ORDER — DICYCLOMINE HCL 10 MG PO CAPS: 10.0000 mg | ORAL_CAPSULE | Freq: Three times a day (TID) | ORAL | 11 refills | Status: AC

## 2018-12-14 MED ORDER — PANTOPRAZOLE SODIUM 40 MG PO TBEC: 40.0000 mg | DELAYED_RELEASE_TABLET | Freq: Every day | ORAL | 11 refills | Status: DC

## 2018-12-14 NOTE — Progress Notes (Signed)
    History of Present Illness: This is a 73 year old female referred by Samara Snide, MD for epigastric pain, nausea, vomiting, decreased appetite, weight loss.  She relates a 2 to 3 month history of epigastric pain, decreased appetite and nausea that is worse in the mornings.  She relates rare episodes of vomiting, which could be regurgitation.  She relates a gradual weight loss of 50 pounds over the past year without trying.  Per her report her weight loss has accelerated over the past 2 to 3 months.  Weight is 154 today and weight was 175 in October 2019.  She has very mild intermittent constipation.  She relates blood work was performed at Dr. Doreene Adas office in January and she does not recall any abnormalities conveyed.  Abdominal/pelvic CT as below.  Colonoscopy performed last October as below. Denies diarrhea, change in stool caliber, melena, hematochezia, dysphagia, chest pain.   IMPRESSION: 1. Colonic diverticulosis, without radiographic evidence of diverticulitis or other acute findings. 2. Small uterine fibroids. 3. Small benign right adrenal adenoma.  Colonoscopy 08/2018: - Three 6 to 9 mm polyps in the sigmoid colon, in the descending colon and in the ascending colon, removed with a cold snare. Resected and retrieved. - One 4 mm polyp in the transverse colon, removed with a cold biopsy forceps. Resected and retrieved. - Diverticulosis in the left colon. - The examination was otherwise normal on direct and retroflexion views.  Current Medications, Allergies, Past Medical History, Past Surgical History, Family History and Social History were reviewed in [employer] record.  Physical Exam: General: Well developed, well nourished, no acute distress Head: Normocephalic and atraumatic Eyes:  sclerae anicteric, EOMI Ears: Normal auditory acuity Mouth: No deformity or lesions Lungs: Clear throughout to auscultation Heart: Regular rate and rhythm; no murmurs,  rubs or bruits Abdomen: Soft, non tender and non distended. No masses, hepatosplenomegaly or hernias noted. Normal Bowel sounds Rectal: Not done Musculoskeletal: Symmetrical with no gross deformities  Pulses:  Normal pulses noted Extremities: No clubbing, cyanosis, edema or deformities noted Neurological: Alert oriented x 4, grossly nonfocal Psychological:  Alert and cooperative. Normal mood and affect   Assessment and Recommendations:  1.  Epigastric pain, decreased appetite, nausea, weight loss.  Rule out ulcer, esophagitis, neoplasm.  Begin pantoprazole 40 mg daily.  Begin dicyclomine 10 mg 3 times daily AC.  Request blood work from Dr. Doreene Adas office.  Schedule EGD. The risks (including bleeding, perforation, infection, missed lesions, medication reactions and possible hospitalization or surgery if complications occur), benefits, and alternatives to endoscopy with possible biopsy and possible dilation were discussed with the patient and they consent to proceed.   2.  Personal history of adenomatous colon polyps.  A 3-year interval surveillance colonoscopy is recommended in October 2024.  3.  Mild constipation and diverticulosis.  Increase dietary fiber and water intake.

## 2018-12-14 NOTE — Patient Instructions (Signed)
We have sent the following medications to your pharmacy for you to pick up at your convenience: pantoprazole and dicyclomine.   You have been scheduled for an endoscopy. Please follow written instructions given to you at your visit today. If you use inhalers (even only as needed), please bring them with you on the day of your procedure. Your physician has requested that you go to www.startemmi.com and enter the access code given to you at your visit today. This web site gives a general overview about your procedure. However, you should still follow specific instructions given to you by our office regarding your preparation for the procedure.  Thank you for choosing me and [facility].  Pricilla Riffle. Dagoberto Ligas., MD., Marval Regal

## 2018-12-29 DIAGNOSIS — R7303 Prediabetes: Secondary | ICD-10-CM | POA: Diagnosis not present

## 2018-12-29 DIAGNOSIS — R109 Unspecified abdominal pain: Secondary | ICD-10-CM | POA: Diagnosis not present

## 2018-12-29 DIAGNOSIS — E7800 Pure hypercholesterolemia, unspecified: Secondary | ICD-10-CM | POA: Diagnosis not present

## 2018-12-29 DIAGNOSIS — I10 Essential (primary) hypertension: Secondary | ICD-10-CM | POA: Diagnosis not present

## 2018-12-29 DIAGNOSIS — F3170 Bipolar disorder, currently in remission, most recent episode unspecified: Secondary | ICD-10-CM | POA: Diagnosis not present

## 2018-12-29 DIAGNOSIS — R634 Abnormal weight loss: Secondary | ICD-10-CM | POA: Diagnosis not present

## 2019-01-03 ENCOUNTER — Ambulatory Visit (AMBULATORY_SURGERY_CENTER): Payer: Medicare HMO | Admitting: Gastroenterology

## 2019-01-03 ENCOUNTER — Encounter: Payer: Self-pay | Admitting: Gastroenterology

## 2019-01-03 VITALS — BP 116/71 | HR 62 | Temp 97.3°F | Resp 20 | Ht 63.0 in | Wt 154.0 lb

## 2019-01-03 DIAGNOSIS — R112 Nausea with vomiting, unspecified: Secondary | ICD-10-CM

## 2019-01-03 DIAGNOSIS — I272 Pulmonary hypertension, unspecified: Secondary | ICD-10-CM | POA: Diagnosis not present

## 2019-01-03 DIAGNOSIS — R1013 Epigastric pain: Secondary | ICD-10-CM | POA: Diagnosis not present

## 2019-01-03 DIAGNOSIS — I10 Essential (primary) hypertension: Secondary | ICD-10-CM | POA: Diagnosis not present

## 2019-01-03 DIAGNOSIS — R634 Abnormal weight loss: Secondary | ICD-10-CM | POA: Diagnosis not present

## 2019-01-03 DIAGNOSIS — F319 Bipolar disorder, unspecified: Secondary | ICD-10-CM | POA: Diagnosis not present

## 2019-01-03 DIAGNOSIS — K219 Gastro-esophageal reflux disease without esophagitis: Secondary | ICD-10-CM | POA: Diagnosis not present

## 2019-01-03 MED ORDER — SODIUM CHLORIDE 0.9 % IV SOLN: 500.0000 mL | Freq: Once | INTRAVENOUS | Status: DC

## 2019-01-03 NOTE — Progress Notes (Signed)
Report to PACU, RN, vss, BBS= Clear.  

## 2019-01-03 NOTE — Patient Instructions (Signed)
  Gastric emptying study to be ordered  RUQ ultrasound to be ordered---both of these will be set up by Dr Lynne Leader office nurse and she will call you with details      YOU HAD AN ENDOSCOPIC PROCEDURE TODAY AT [facility]:   Refer to the procedure report that was given to you for any specific questions about what was found during the examination.  If the procedure report does not answer your questions, please call your gastroenterologist to clarify.  If you requested that your care partner not be given the details of your procedure findings, then the procedure report has been included in a sealed envelope for you to review at your convenience later.  YOU SHOULD EXPECT: Some feelings of bloating in the abdomen. Passage of more gas than usual.  Walking can help get rid of the air that was put into your GI tract during the procedure and reduce the bloating. If you had a lower endoscopy (such as a colonoscopy or flexible sigmoidoscopy) you may notice spotting of blood in your stool or on the toilet paper. If you underwent a bowel prep for your procedure, you may not have a normal bowel movement for a few days.  Please Note:  You might notice some irritation and congestion in your nose or some drainage.  This is from the oxygen used during your procedure.  There is no need for concern and it should clear up in a day or so.  SYMPTOMS TO REPORT IMMEDIATELY:      Following upper endoscopy (EGD)  Vomiting of blood or coffee ground material  New chest pain or pain under the shoulder blades  Painful or persistently difficult swallowing  New shortness of breath  Fever of 100F or higher  Black, tarry-looking stools  For urgent or emergent issues, a gastroenterologist can be reached at any hour by calling [phone].   DIET:  We do recommend a small meal at first, but then you may proceed to your regular diet.  Drink plenty of fluids but you should avoid alcoholic beverages for 24  hours.  ACTIVITY:  You should plan to take it easy for the rest of today and you should NOT DRIVE or use heavy machinery until tomorrow (because of the sedation medicines used during the test).    FOLLOW UP: Our staff will call the number listed on your records the next business day following your procedure to check on you and address any questions or concerns that you may have regarding the information given to you following your procedure. If we do not reach you, we will leave a message.  However, if you are feeling well and you are not experiencing any problems, there is no need to return our call.  We will assume that you have returned to your regular daily activities without incident.  If any biopsies were taken you will be contacted by phone or by letter within the next 1-3 weeks.  Please call us at [phone] if you have not heard about the biopsies in 3 weeks.    SIGNATURES/CONFIDENTIALITY: You and/or your care partner have signed paperwork which will be entered into your electronic medical record.  These signatures attest to the fact that that the information above on your After Visit Summary has been reviewed and is understood.  Full responsibility of the confidentiality of this discharge information lies with you and/or your care-partner.

## 2019-01-03 NOTE — Op Note (Signed)
[facility] Patient Name: Heather Wilson Procedure Date: 01/03/2019 1:51 PM MRN: [id number] Endoscopist: Ladene Artist , MD Age: 73 Referring MD:  Date of Birth: [birth year] Gender: Female Account #: [id number] Procedure:                Upper GI endoscopy Indications:              Epigastric abdominal pain, Nausea with vomiting,                            Weight loss Medicines:                Monitored Anesthesia Care Procedure:                Pre-Anesthesia Assessment:                           - Prior to the procedure, a History and Physical                            was performed, and patient medications and                            allergies were reviewed. The patient's tolerance of                            previous anesthesia was also reviewed. The risks                            and benefits of the procedure and the sedation                            options and risks were discussed with the patient.                            All questions were answered, and informed consent                            was obtained. Prior Anticoagulants: The patient has                            taken no previous anticoagulant or antiplatelet                            agents. ASA Grade Assessment: III - A patient with                            severe systemic disease. After reviewing the risks                            and benefits, the patient was deemed in                            satisfactory condition to undergo the procedure.  After obtaining informed consent, the endoscope was                            passed under direct vision. Throughout the                            procedure, the patient's blood pressure, pulse, and                            oxygen saturations were monitored continuously. The                            Endoscope was introduced through the mouth, and                            advanced to the second part of duodenum. The  upper                            GI endoscopy was accomplished without difficulty.                            The patient tolerated the procedure well. Scope In: Scope Out: Findings:                 The examined esophagus was normal.                           A medium amount of food (residue) was found in the                            entire examined stomach. Despite lavage and                            suctioning several areas gastric mucosa were                            obscured                           The exam of the stomach was otherwise normal.                           The duodenal bulb and second portion of the                            duodenum were normal. Complications:            No immediate complications. Estimated Blood Loss:     Estimated blood loss: none. Impression:               - Normal esophagus.                           - A medium amount of food (residue) in the stomach.                           -  Normal duodenal bulb and second portion of the                            duodenum.                           - No specimens collected. Recommendation:           - Patient has a contact number available for                            emergencies. The signs and symptoms of potential                            delayed complications were discussed with the                            patient. Return to normal activities tomorrow.                            Written discharge instructions were provided to the                            patient.                           - Resume previous diet.                           - Continue present medications.                           - Gastric emptying study at the next available                            appointment.                           - RUQ ultrasound at the next available appointment. Ladene Artist, MD 01/03/2019 2:13:44 PM This report has been signed electronically.

## 2019-01-04 ENCOUNTER — Telehealth: Payer: Self-pay | Admitting: *Deleted

## 2019-01-04 ENCOUNTER — Other Ambulatory Visit: Payer: Self-pay

## 2019-01-04 DIAGNOSIS — R112 Nausea with vomiting, unspecified: Secondary | ICD-10-CM

## 2019-01-04 DIAGNOSIS — R634 Abnormal weight loss: Secondary | ICD-10-CM

## 2019-01-04 DIAGNOSIS — R1013 Epigastric pain: Secondary | ICD-10-CM

## 2019-01-04 DIAGNOSIS — K3189 Other diseases of stomach and duodenum: Secondary | ICD-10-CM

## 2019-01-04 NOTE — Telephone Encounter (Signed)
  Follow up Call-  Call back number 01/03/2019 08/26/2018  Post procedure Call Back phone  # [phone]  Permission to leave phone message Yes Yes  Some recent data might be hidden     Patient questions:  Do you have a fever, pain , or abdominal swelling? No. Pain Score  0 *  Have you tolerated food without any problems? Yes.    Have you been able to return to your normal activities? Yes.    Do you have any questions about your discharge instructions: Diet   No. Medications  No. Follow up visit  No.  Do you have questions or concerns about your Care? No.  Actions: * If pain score is 4 or above: No action needed, pain <4.

## 2019-01-04 NOTE — Progress Notes (Signed)
Per Procedure report on 01/03/19 she is scheduled for RUQ [place] and GES on 01/18/19.  She is asked to arrive at [facility] radiology at 7:45 for a 8:00 appt.  She is asked to be NPO after midnight and to hold pantoprazole and dicyclomine for 12 hours.

## 2019-01-18 ENCOUNTER — Other Ambulatory Visit: Payer: Self-pay

## 2019-01-18 ENCOUNTER — Encounter (HOSPITAL_COMMUNITY): Admission: RE | Admit: 2019-01-18 | Payer: Medicare HMO | Source: Ambulatory Visit

## 2019-01-18 ENCOUNTER — Encounter (HOSPITAL_COMMUNITY): Payer: Self-pay

## 2019-01-18 ENCOUNTER — Ambulatory Visit (HOSPITAL_COMMUNITY)
Admission: RE | Admit: 2019-01-18 | Discharge: 2019-01-18 | Disposition: A | Discharge status: Home / Self Care | Payer: Medicare HMO | Source: Ambulatory Visit | Attending: Gastroenterology | Admitting: Gastroenterology

## 2019-01-18 DIAGNOSIS — R1013 Epigastric pain: Secondary | ICD-10-CM

## 2019-01-18 DIAGNOSIS — R112 Nausea with vomiting, unspecified: Secondary | ICD-10-CM

## 2019-01-27 ENCOUNTER — Telehealth: Payer: Self-pay

## 2019-01-27 NOTE — Telephone Encounter (Signed)
Please review the chart.  Radiology is asking if RUQ [place] can be rescheduled due to Covid-19?

## 2019-01-27 NOTE — Telephone Encounter (Signed)
RUQ [place] is urgent

## 2019-01-27 NOTE — Telephone Encounter (Signed)
Heather Wilson at [place] notified

## 2019-01-31 DIAGNOSIS — F259 Schizoaffective disorder, unspecified: Secondary | ICD-10-CM | POA: Diagnosis not present

## 2019-02-02 ENCOUNTER — Other Ambulatory Visit: Payer: Self-pay

## 2019-02-02 ENCOUNTER — Encounter (HOSPITAL_COMMUNITY)
Admission: RE | Admit: 2019-02-02 | Discharge: 2019-02-02 | Disposition: A | Discharge status: Home / Self Care | Payer: Medicare HMO | Source: Ambulatory Visit | Attending: Gastroenterology | Admitting: Gastroenterology

## 2019-02-02 ENCOUNTER — Ambulatory Visit (HOSPITAL_COMMUNITY)
Admission: RE | Admit: 2019-02-02 | Discharge: 2019-02-02 | Disposition: A | Discharge status: Home / Self Care | Payer: Medicare HMO | Source: Ambulatory Visit | Attending: Gastroenterology | Admitting: Gastroenterology

## 2019-02-02 DIAGNOSIS — K3189 Other diseases of stomach and duodenum: Secondary | ICD-10-CM | POA: Diagnosis not present

## 2019-02-02 DIAGNOSIS — R634 Abnormal weight loss: Secondary | ICD-10-CM | POA: Diagnosis not present

## 2019-02-02 DIAGNOSIS — R1013 Epigastric pain: Secondary | ICD-10-CM | POA: Insufficient documentation

## 2019-02-02 DIAGNOSIS — K7589 Other specified inflammatory liver diseases: Secondary | ICD-10-CM | POA: Diagnosis not present

## 2019-02-02 DIAGNOSIS — R112 Nausea with vomiting, unspecified: Secondary | ICD-10-CM | POA: Insufficient documentation

## 2019-02-02 DIAGNOSIS — R933 Abnormal findings on diagnostic imaging of other parts of digestive tract: Secondary | ICD-10-CM | POA: Diagnosis not present

## 2019-02-02 MED ORDER — TECHNETIUM TC 99M SULFUR COLLOID
2.1000 | Freq: Once | INTRAVENOUS | Status: AC | PRN
  Administered 2019-02-02: 2.1 via INTRAVENOUS

## 2019-07-08 DIAGNOSIS — F0634 Mood disorder due to known physiological condition with mixed features: Secondary | ICD-10-CM | POA: Diagnosis not present

## 2019-08-08 DIAGNOSIS — F259 Schizoaffective disorder, unspecified: Secondary | ICD-10-CM | POA: Diagnosis not present

## 2019-09-09 DIAGNOSIS — I10 Essential (primary) hypertension: Secondary | ICD-10-CM | POA: Diagnosis not present

## 2019-09-09 DIAGNOSIS — F3170 Bipolar disorder, currently in remission, most recent episode unspecified: Secondary | ICD-10-CM | POA: Diagnosis not present

## 2019-09-09 DIAGNOSIS — R7303 Prediabetes: Secondary | ICD-10-CM | POA: Diagnosis not present

## 2019-09-09 DIAGNOSIS — E7800 Pure hypercholesterolemia, unspecified: Secondary | ICD-10-CM | POA: Diagnosis not present

## 2019-09-28 DIAGNOSIS — I10 Essential (primary) hypertension: Secondary | ICD-10-CM | POA: Diagnosis not present

## 2019-09-28 DIAGNOSIS — E7800 Pure hypercholesterolemia, unspecified: Secondary | ICD-10-CM | POA: Diagnosis not present

## 2019-09-28 DIAGNOSIS — R7303 Prediabetes: Secondary | ICD-10-CM | POA: Diagnosis not present

## 2019-11-07 DIAGNOSIS — F259 Schizoaffective disorder, unspecified: Secondary | ICD-10-CM | POA: Diagnosis not present

## 2020-02-06 DIAGNOSIS — F314 Bipolar disorder, current episode depressed, severe, without psychotic features: Secondary | ICD-10-CM | POA: Diagnosis not present

## 2020-02-09 DIAGNOSIS — F3170 Bipolar disorder, currently in remission, most recent episode unspecified: Secondary | ICD-10-CM | POA: Diagnosis not present

## 2020-02-09 DIAGNOSIS — I10 Essential (primary) hypertension: Secondary | ICD-10-CM | POA: Diagnosis not present

## 2020-02-09 DIAGNOSIS — E7800 Pure hypercholesterolemia, unspecified: Secondary | ICD-10-CM | POA: Diagnosis not present

## 2020-02-09 DIAGNOSIS — M1990 Unspecified osteoarthritis, unspecified site: Secondary | ICD-10-CM | POA: Diagnosis not present

## 2020-03-13 DIAGNOSIS — E7800 Pure hypercholesterolemia, unspecified: Secondary | ICD-10-CM | POA: Diagnosis not present

## 2020-03-13 DIAGNOSIS — R7303 Prediabetes: Secondary | ICD-10-CM | POA: Diagnosis not present

## 2020-03-13 DIAGNOSIS — F3170 Bipolar disorder, currently in remission, most recent episode unspecified: Secondary | ICD-10-CM | POA: Diagnosis not present

## 2020-03-13 DIAGNOSIS — I10 Essential (primary) hypertension: Secondary | ICD-10-CM | POA: Diagnosis not present

## 2020-03-13 DIAGNOSIS — F419 Anxiety disorder, unspecified: Secondary | ICD-10-CM | POA: Diagnosis not present

## 2020-04-10 DIAGNOSIS — E7800 Pure hypercholesterolemia, unspecified: Secondary | ICD-10-CM | POA: Diagnosis not present

## 2020-04-10 DIAGNOSIS — F3170 Bipolar disorder, currently in remission, most recent episode unspecified: Secondary | ICD-10-CM | POA: Diagnosis not present

## 2020-04-10 DIAGNOSIS — I10 Essential (primary) hypertension: Secondary | ICD-10-CM | POA: Diagnosis not present

## 2020-04-10 DIAGNOSIS — M1990 Unspecified osteoarthritis, unspecified site: Secondary | ICD-10-CM | POA: Diagnosis not present

## 2020-05-30 DIAGNOSIS — M1990 Unspecified osteoarthritis, unspecified site: Secondary | ICD-10-CM | POA: Diagnosis not present

## 2020-05-30 DIAGNOSIS — F3170 Bipolar disorder, currently in remission, most recent episode unspecified: Secondary | ICD-10-CM | POA: Diagnosis not present

## 2020-05-30 DIAGNOSIS — E7800 Pure hypercholesterolemia, unspecified: Secondary | ICD-10-CM | POA: Diagnosis not present

## 2020-05-30 DIAGNOSIS — I10 Essential (primary) hypertension: Secondary | ICD-10-CM | POA: Diagnosis not present

## 2020-07-05 DIAGNOSIS — F314 Bipolar disorder, current episode depressed, severe, without psychotic features: Secondary | ICD-10-CM | POA: Diagnosis not present

## 2020-07-05 DIAGNOSIS — F0634 Mood disorder due to known physiological condition with mixed features: Secondary | ICD-10-CM | POA: Diagnosis not present

## 2020-08-14 DIAGNOSIS — M1990 Unspecified osteoarthritis, unspecified site: Secondary | ICD-10-CM | POA: Diagnosis not present

## 2020-08-14 DIAGNOSIS — E7800 Pure hypercholesterolemia, unspecified: Secondary | ICD-10-CM | POA: Diagnosis not present

## 2020-08-14 DIAGNOSIS — F3170 Bipolar disorder, currently in remission, most recent episode unspecified: Secondary | ICD-10-CM | POA: Diagnosis not present

## 2020-08-14 DIAGNOSIS — I10 Essential (primary) hypertension: Secondary | ICD-10-CM | POA: Diagnosis not present

## 2020-09-11 DIAGNOSIS — H35363 Drusen (degenerative) of macula, bilateral: Secondary | ICD-10-CM | POA: Diagnosis not present

## 2020-09-11 DIAGNOSIS — H25013 Cortical age-related cataract, bilateral: Secondary | ICD-10-CM | POA: Diagnosis not present

## 2020-09-11 DIAGNOSIS — H5203 Hypermetropia, bilateral: Secondary | ICD-10-CM | POA: Diagnosis not present

## 2020-09-11 DIAGNOSIS — H52223 Regular astigmatism, bilateral: Secondary | ICD-10-CM | POA: Diagnosis not present

## 2020-09-11 DIAGNOSIS — H2511 Age-related nuclear cataract, right eye: Secondary | ICD-10-CM | POA: Diagnosis not present

## 2020-09-11 DIAGNOSIS — H524 Presbyopia: Secondary | ICD-10-CM | POA: Diagnosis not present

## 2020-09-20 DIAGNOSIS — R7303 Prediabetes: Secondary | ICD-10-CM | POA: Diagnosis not present

## 2020-09-20 DIAGNOSIS — E559 Vitamin D deficiency, unspecified: Secondary | ICD-10-CM | POA: Diagnosis not present

## 2020-09-20 DIAGNOSIS — E039 Hypothyroidism, unspecified: Secondary | ICD-10-CM | POA: Diagnosis not present

## 2020-09-20 DIAGNOSIS — E669 Obesity, unspecified: Secondary | ICD-10-CM | POA: Diagnosis not present

## 2020-09-20 DIAGNOSIS — F39 Unspecified mood [affective] disorder: Secondary | ICD-10-CM | POA: Diagnosis not present

## 2020-09-20 DIAGNOSIS — E7800 Pure hypercholesterolemia, unspecified: Secondary | ICD-10-CM | POA: Diagnosis not present

## 2020-09-20 DIAGNOSIS — I10 Essential (primary) hypertension: Secondary | ICD-10-CM | POA: Diagnosis not present

## 2020-10-11 DIAGNOSIS — I10 Essential (primary) hypertension: Secondary | ICD-10-CM | POA: Diagnosis not present

## 2020-10-11 DIAGNOSIS — E7800 Pure hypercholesterolemia, unspecified: Secondary | ICD-10-CM | POA: Diagnosis not present

## 2020-10-11 DIAGNOSIS — R7303 Prediabetes: Secondary | ICD-10-CM | POA: Diagnosis not present

## 2020-10-11 DIAGNOSIS — F39 Unspecified mood [affective] disorder: Secondary | ICD-10-CM | POA: Diagnosis not present

## 2020-11-26 DIAGNOSIS — F259 Schizoaffective disorder, unspecified: Secondary | ICD-10-CM | POA: Diagnosis not present

## 2020-11-26 DIAGNOSIS — F0634 Mood disorder due to known physiological condition with mixed features: Secondary | ICD-10-CM | POA: Diagnosis not present

## 2020-11-29 DIAGNOSIS — M1990 Unspecified osteoarthritis, unspecified site: Secondary | ICD-10-CM | POA: Diagnosis not present

## 2020-11-29 DIAGNOSIS — I10 Essential (primary) hypertension: Secondary | ICD-10-CM | POA: Diagnosis not present

## 2020-11-29 DIAGNOSIS — F3170 Bipolar disorder, currently in remission, most recent episode unspecified: Secondary | ICD-10-CM | POA: Diagnosis not present

## 2020-11-29 DIAGNOSIS — E7800 Pure hypercholesterolemia, unspecified: Secondary | ICD-10-CM | POA: Diagnosis not present

## 2020-11-29 DIAGNOSIS — E039 Hypothyroidism, unspecified: Secondary | ICD-10-CM | POA: Diagnosis not present

## 2020-12-07 DIAGNOSIS — Z01419 Encounter for gynecological examination (general) (routine) without abnormal findings: Secondary | ICD-10-CM | POA: Diagnosis not present

## 2020-12-07 DIAGNOSIS — Z1231 Encounter for screening mammogram for malignant neoplasm of breast: Secondary | ICD-10-CM | POA: Diagnosis not present

## 2020-12-21 DIAGNOSIS — E559 Vitamin D deficiency, unspecified: Secondary | ICD-10-CM | POA: Diagnosis not present

## 2021-01-28 DIAGNOSIS — M1990 Unspecified osteoarthritis, unspecified site: Secondary | ICD-10-CM | POA: Diagnosis not present

## 2021-01-28 DIAGNOSIS — E7800 Pure hypercholesterolemia, unspecified: Secondary | ICD-10-CM | POA: Diagnosis not present

## 2021-01-28 DIAGNOSIS — F3170 Bipolar disorder, currently in remission, most recent episode unspecified: Secondary | ICD-10-CM | POA: Diagnosis not present

## 2021-01-28 DIAGNOSIS — E039 Hypothyroidism, unspecified: Secondary | ICD-10-CM | POA: Diagnosis not present

## 2021-01-28 DIAGNOSIS — I10 Essential (primary) hypertension: Secondary | ICD-10-CM | POA: Diagnosis not present

## 2021-03-21 DIAGNOSIS — E7800 Pure hypercholesterolemia, unspecified: Secondary | ICD-10-CM | POA: Diagnosis not present

## 2021-03-21 DIAGNOSIS — E039 Hypothyroidism, unspecified: Secondary | ICD-10-CM | POA: Diagnosis not present

## 2021-03-21 DIAGNOSIS — I10 Essential (primary) hypertension: Secondary | ICD-10-CM | POA: Diagnosis not present

## 2021-03-21 DIAGNOSIS — M1990 Unspecified osteoarthritis, unspecified site: Secondary | ICD-10-CM | POA: Diagnosis not present

## 2021-03-21 DIAGNOSIS — F3170 Bipolar disorder, currently in remission, most recent episode unspecified: Secondary | ICD-10-CM | POA: Diagnosis not present

## 2021-04-11 DIAGNOSIS — E559 Vitamin D deficiency, unspecified: Secondary | ICD-10-CM | POA: Diagnosis not present

## 2021-04-11 DIAGNOSIS — E039 Hypothyroidism, unspecified: Secondary | ICD-10-CM | POA: Diagnosis not present

## 2021-04-11 DIAGNOSIS — I10 Essential (primary) hypertension: Secondary | ICD-10-CM | POA: Diagnosis not present

## 2021-04-11 DIAGNOSIS — R7309 Other abnormal glucose: Secondary | ICD-10-CM | POA: Diagnosis not present

## 2021-04-11 DIAGNOSIS — E7800 Pure hypercholesterolemia, unspecified: Secondary | ICD-10-CM | POA: Diagnosis not present

## 2021-04-11 DIAGNOSIS — N3946 Mixed incontinence: Secondary | ICD-10-CM | POA: Diagnosis not present

## 2021-04-11 DIAGNOSIS — F39 Unspecified mood [affective] disorder: Secondary | ICD-10-CM | POA: Diagnosis not present

## 2021-04-11 DIAGNOSIS — F3170 Bipolar disorder, currently in remission, most recent episode unspecified: Secondary | ICD-10-CM | POA: Diagnosis not present

## 2021-04-22 DIAGNOSIS — I10 Essential (primary) hypertension: Secondary | ICD-10-CM | POA: Diagnosis not present

## 2021-04-22 DIAGNOSIS — F259 Schizoaffective disorder, unspecified: Secondary | ICD-10-CM | POA: Diagnosis not present

## 2021-04-22 DIAGNOSIS — F3170 Bipolar disorder, currently in remission, most recent episode unspecified: Secondary | ICD-10-CM | POA: Diagnosis not present

## 2021-04-22 DIAGNOSIS — M1990 Unspecified osteoarthritis, unspecified site: Secondary | ICD-10-CM | POA: Diagnosis not present

## 2021-04-22 DIAGNOSIS — E039 Hypothyroidism, unspecified: Secondary | ICD-10-CM | POA: Diagnosis not present

## 2021-04-22 DIAGNOSIS — F0634 Mood disorder due to known physiological condition with mixed features: Secondary | ICD-10-CM | POA: Diagnosis not present

## 2021-04-22 DIAGNOSIS — E7800 Pure hypercholesterolemia, unspecified: Secondary | ICD-10-CM | POA: Diagnosis not present

## 2021-05-03 DIAGNOSIS — E871 Hypo-osmolality and hyponatremia: Secondary | ICD-10-CM | POA: Diagnosis not present

## 2021-05-17 DIAGNOSIS — I10 Essential (primary) hypertension: Secondary | ICD-10-CM | POA: Diagnosis not present

## 2021-05-17 DIAGNOSIS — E7800 Pure hypercholesterolemia, unspecified: Secondary | ICD-10-CM | POA: Diagnosis not present

## 2021-05-17 DIAGNOSIS — N3946 Mixed incontinence: Secondary | ICD-10-CM | POA: Diagnosis not present

## 2021-05-17 DIAGNOSIS — F3170 Bipolar disorder, currently in remission, most recent episode unspecified: Secondary | ICD-10-CM | POA: Diagnosis not present

## 2021-05-17 DIAGNOSIS — R7303 Prediabetes: Secondary | ICD-10-CM | POA: Diagnosis not present

## 2021-05-24 DIAGNOSIS — F3170 Bipolar disorder, currently in remission, most recent episode unspecified: Secondary | ICD-10-CM | POA: Diagnosis not present

## 2021-05-24 DIAGNOSIS — E7800 Pure hypercholesterolemia, unspecified: Secondary | ICD-10-CM | POA: Diagnosis not present

## 2021-05-24 DIAGNOSIS — M1990 Unspecified osteoarthritis, unspecified site: Secondary | ICD-10-CM | POA: Diagnosis not present

## 2021-05-24 DIAGNOSIS — I10 Essential (primary) hypertension: Secondary | ICD-10-CM | POA: Diagnosis not present

## 2021-05-24 DIAGNOSIS — E039 Hypothyroidism, unspecified: Secondary | ICD-10-CM | POA: Diagnosis not present

## 2021-06-11 DIAGNOSIS — M1990 Unspecified osteoarthritis, unspecified site: Secondary | ICD-10-CM | POA: Diagnosis not present

## 2021-06-11 DIAGNOSIS — E039 Hypothyroidism, unspecified: Secondary | ICD-10-CM | POA: Diagnosis not present

## 2021-06-11 DIAGNOSIS — I10 Essential (primary) hypertension: Secondary | ICD-10-CM | POA: Diagnosis not present

## 2021-06-11 DIAGNOSIS — E7800 Pure hypercholesterolemia, unspecified: Secondary | ICD-10-CM | POA: Diagnosis not present

## 2021-06-11 DIAGNOSIS — F3170 Bipolar disorder, currently in remission, most recent episode unspecified: Secondary | ICD-10-CM | POA: Diagnosis not present

## 2021-06-12 DIAGNOSIS — F0634 Mood disorder due to known physiological condition with mixed features: Secondary | ICD-10-CM | POA: Diagnosis not present

## 2021-06-12 DIAGNOSIS — F259 Schizoaffective disorder, unspecified: Secondary | ICD-10-CM | POA: Diagnosis not present

## 2021-06-17 DIAGNOSIS — E559 Vitamin D deficiency, unspecified: Secondary | ICD-10-CM | POA: Diagnosis not present

## 2021-06-17 DIAGNOSIS — I10 Essential (primary) hypertension: Secondary | ICD-10-CM | POA: Diagnosis not present

## 2021-06-17 DIAGNOSIS — R7303 Prediabetes: Secondary | ICD-10-CM | POA: Diagnosis not present

## 2021-06-17 DIAGNOSIS — E039 Hypothyroidism, unspecified: Secondary | ICD-10-CM | POA: Diagnosis not present

## 2021-06-17 DIAGNOSIS — F3170 Bipolar disorder, currently in remission, most recent episode unspecified: Secondary | ICD-10-CM | POA: Diagnosis not present

## 2021-06-17 DIAGNOSIS — N3946 Mixed incontinence: Secondary | ICD-10-CM | POA: Diagnosis not present

## 2021-06-17 DIAGNOSIS — E7800 Pure hypercholesterolemia, unspecified: Secondary | ICD-10-CM | POA: Diagnosis not present

## 2021-08-09 DIAGNOSIS — F314 Bipolar disorder, current episode depressed, severe, without psychotic features: Secondary | ICD-10-CM | POA: Diagnosis not present

## 2021-09-17 DIAGNOSIS — J019 Acute sinusitis, unspecified: Secondary | ICD-10-CM | POA: Diagnosis not present

## 2021-10-02 DIAGNOSIS — F3170 Bipolar disorder, currently in remission, most recent episode unspecified: Secondary | ICD-10-CM | POA: Diagnosis not present

## 2021-10-02 DIAGNOSIS — I10 Essential (primary) hypertension: Secondary | ICD-10-CM | POA: Diagnosis not present

## 2021-10-02 DIAGNOSIS — E039 Hypothyroidism, unspecified: Secondary | ICD-10-CM | POA: Diagnosis not present

## 2021-10-02 DIAGNOSIS — M1990 Unspecified osteoarthritis, unspecified site: Secondary | ICD-10-CM | POA: Diagnosis not present

## 2021-10-02 DIAGNOSIS — E7800 Pure hypercholesterolemia, unspecified: Secondary | ICD-10-CM | POA: Diagnosis not present

## 2021-11-07 DIAGNOSIS — F0634 Mood disorder due to known physiological condition with mixed features: Secondary | ICD-10-CM | POA: Diagnosis not present

## 2021-11-07 DIAGNOSIS — F259 Schizoaffective disorder, unspecified: Secondary | ICD-10-CM | POA: Diagnosis not present

## 2021-12-04 DIAGNOSIS — F3170 Bipolar disorder, currently in remission, most recent episode unspecified: Secondary | ICD-10-CM | POA: Diagnosis not present

## 2021-12-04 DIAGNOSIS — N3281 Overactive bladder: Secondary | ICD-10-CM | POA: Diagnosis not present

## 2021-12-04 DIAGNOSIS — L309 Dermatitis, unspecified: Secondary | ICD-10-CM | POA: Diagnosis not present

## 2021-12-04 DIAGNOSIS — E7800 Pure hypercholesterolemia, unspecified: Secondary | ICD-10-CM | POA: Diagnosis not present

## 2021-12-04 DIAGNOSIS — Z23 Encounter for immunization: Secondary | ICD-10-CM | POA: Diagnosis not present

## 2021-12-04 DIAGNOSIS — R7303 Prediabetes: Secondary | ICD-10-CM | POA: Diagnosis not present

## 2021-12-04 DIAGNOSIS — I10 Essential (primary) hypertension: Secondary | ICD-10-CM | POA: Diagnosis not present

## 2021-12-19 DIAGNOSIS — Z1231 Encounter for screening mammogram for malignant neoplasm of breast: Secondary | ICD-10-CM | POA: Diagnosis not present

## 2022-04-16 DIAGNOSIS — F3170 Bipolar disorder, currently in remission, most recent episode unspecified: Secondary | ICD-10-CM | POA: Diagnosis not present

## 2022-04-16 DIAGNOSIS — E7800 Pure hypercholesterolemia, unspecified: Secondary | ICD-10-CM | POA: Diagnosis not present

## 2022-04-16 DIAGNOSIS — I10 Essential (primary) hypertension: Secondary | ICD-10-CM | POA: Diagnosis not present

## 2022-04-16 DIAGNOSIS — E039 Hypothyroidism, unspecified: Secondary | ICD-10-CM | POA: Diagnosis not present

## 2022-05-13 DIAGNOSIS — I10 Essential (primary) hypertension: Secondary | ICD-10-CM | POA: Diagnosis not present

## 2022-05-13 DIAGNOSIS — F3170 Bipolar disorder, currently in remission, most recent episode unspecified: Secondary | ICD-10-CM | POA: Diagnosis not present

## 2022-05-13 DIAGNOSIS — E7800 Pure hypercholesterolemia, unspecified: Secondary | ICD-10-CM | POA: Diagnosis not present

## 2022-05-13 DIAGNOSIS — R7303 Prediabetes: Secondary | ICD-10-CM | POA: Diagnosis not present

## 2022-05-13 DIAGNOSIS — J209 Acute bronchitis, unspecified: Secondary | ICD-10-CM | POA: Diagnosis not present

## 2022-06-05 DIAGNOSIS — F0634 Mood disorder due to known physiological condition with mixed features: Secondary | ICD-10-CM | POA: Diagnosis not present

## 2022-06-05 DIAGNOSIS — F259 Schizoaffective disorder, unspecified: Secondary | ICD-10-CM | POA: Diagnosis not present

## 2022-06-20 DIAGNOSIS — E2839 Other primary ovarian failure: Secondary | ICD-10-CM | POA: Diagnosis not present

## 2022-06-20 DIAGNOSIS — R7303 Prediabetes: Secondary | ICD-10-CM | POA: Diagnosis not present

## 2022-06-20 DIAGNOSIS — Z0000 Encounter for general adult medical examination without abnormal findings: Secondary | ICD-10-CM | POA: Diagnosis not present

## 2022-06-20 DIAGNOSIS — I10 Essential (primary) hypertension: Secondary | ICD-10-CM | POA: Diagnosis not present

## 2022-06-20 DIAGNOSIS — N3281 Overactive bladder: Secondary | ICD-10-CM | POA: Diagnosis not present

## 2022-06-20 DIAGNOSIS — F3170 Bipolar disorder, currently in remission, most recent episode unspecified: Secondary | ICD-10-CM | POA: Diagnosis not present

## 2022-06-20 DIAGNOSIS — Z1211 Encounter for screening for malignant neoplasm of colon: Secondary | ICD-10-CM | POA: Diagnosis not present

## 2022-06-20 DIAGNOSIS — E7800 Pure hypercholesterolemia, unspecified: Secondary | ICD-10-CM | POA: Diagnosis not present

## 2022-06-24 ENCOUNTER — Other Ambulatory Visit: Payer: Self-pay | Admitting: Family Medicine

## 2022-06-24 DIAGNOSIS — E2839 Other primary ovarian failure: Secondary | ICD-10-CM

## 2022-07-17 ENCOUNTER — Ambulatory Visit
Admission: RE | Admit: 2022-07-17 | Discharge: 2022-07-17 | Disposition: A | Discharge status: Home / Self Care | Payer: Medicare HMO | Source: Ambulatory Visit | Attending: Family Medicine | Admitting: Family Medicine

## 2022-07-17 DIAGNOSIS — Z780 Asymptomatic menopausal state: Secondary | ICD-10-CM | POA: Diagnosis not present

## 2022-07-17 DIAGNOSIS — E2839 Other primary ovarian failure: Secondary | ICD-10-CM

## 2022-07-17 DIAGNOSIS — M810 Age-related osteoporosis without current pathological fracture: Secondary | ICD-10-CM | POA: Diagnosis not present

## 2022-07-17 DIAGNOSIS — M85832 Other specified disorders of bone density and structure, left forearm: Secondary | ICD-10-CM | POA: Diagnosis not present

## 2022-08-01 ENCOUNTER — Encounter: Payer: Self-pay | Admitting: Gastroenterology

## 2022-08-06 ENCOUNTER — Telehealth: Payer: Self-pay | Admitting: *Deleted

## 2022-08-06 NOTE — Telephone Encounter (Signed)
OK to proceed with direct colonoscopy in [place].

## 2022-08-06 NOTE — Telephone Encounter (Signed)
Noted  

## 2022-08-06 NOTE — Telephone Encounter (Signed)
Pt. In for recall procedure 09/15/22,please review and advise if pt. Needs OV ?

## 2022-08-13 DIAGNOSIS — F0634 Mood disorder due to known physiological condition with mixed features: Secondary | ICD-10-CM | POA: Diagnosis not present

## 2022-08-13 DIAGNOSIS — F259 Schizoaffective disorder, unspecified: Secondary | ICD-10-CM | POA: Diagnosis not present

## 2022-08-15 ENCOUNTER — Other Ambulatory Visit: Payer: Self-pay

## 2022-08-15 ENCOUNTER — Ambulatory Visit (AMBULATORY_SURGERY_CENTER): Payer: Self-pay

## 2022-08-15 VITALS — Ht 63.75 in | Wt 187.0 lb

## 2022-08-15 DIAGNOSIS — Z86010 Personal history of colonic polyps: Secondary | ICD-10-CM

## 2022-08-15 MED ORDER — NA SULFATE-K SULFATE-MG SULF 17.5-3.13-1.6 GM/177ML PO SOLN: 1.0000 | Freq: Once | ORAL | 0 refills | Status: AC

## 2022-08-15 NOTE — Progress Notes (Signed)
Denies allergies to eggs or soy products. Denies complication of anesthesia or sedation. Denies use of weight loss medication. Denies use of O2.   Emmi instructions given for colonoscopy.  

## 2022-09-04 ENCOUNTER — Encounter: Payer: Self-pay | Admitting: Gastroenterology

## 2022-09-15 ENCOUNTER — Encounter: Payer: Self-pay | Admitting: Gastroenterology

## 2022-09-15 ENCOUNTER — Ambulatory Visit (AMBULATORY_SURGERY_CENTER): Payer: Medicare HMO | Admitting: Gastroenterology

## 2022-09-15 VITALS — BP 126/77 | HR 67 | Temp 96.0°F | Resp 13 | Ht 63.0 in | Wt 187.0 lb

## 2022-09-15 DIAGNOSIS — D124 Benign neoplasm of descending colon: Secondary | ICD-10-CM

## 2022-09-15 DIAGNOSIS — E785 Hyperlipidemia, unspecified: Secondary | ICD-10-CM | POA: Diagnosis not present

## 2022-09-15 DIAGNOSIS — Z86010 Personal history of colonic polyps: Secondary | ICD-10-CM

## 2022-09-15 DIAGNOSIS — Z09 Encounter for follow-up examination after completed treatment for conditions other than malignant neoplasm: Secondary | ICD-10-CM

## 2022-09-15 DIAGNOSIS — F419 Anxiety disorder, unspecified: Secondary | ICD-10-CM | POA: Diagnosis not present

## 2022-09-15 DIAGNOSIS — I10 Essential (primary) hypertension: Secondary | ICD-10-CM | POA: Diagnosis not present

## 2022-09-15 DIAGNOSIS — F32A Depression, unspecified: Secondary | ICD-10-CM | POA: Diagnosis not present

## 2022-09-15 MED ORDER — SODIUM CHLORIDE 0.9 % IV SOLN: 500.0000 mL | Freq: Once | INTRAVENOUS | Status: DC

## 2022-09-15 NOTE — Op Note (Addendum)
[facility] Patient Name: Heather Wilson Procedure Date: 09/15/2022 9:51 AM MRN: [id number] Endoscopist: Ladene Artist , MD, [id number] Age: 76 Referring MD:  Date of Birth: [birth year] Gender: Female Account #: [id number] Procedure:                Colonoscopy Indications:              Surveillance: Personal history of adenomatous                            polyps on last colonoscopy 5 years ago Medicines:                Monitored Anesthesia Care Procedure:                Pre-Anesthesia Assessment:                           - Prior to the procedure, a History and Physical                            was performed, and patient medications and                            allergies were reviewed. The patient's tolerance of                            previous anesthesia was also reviewed. The risks                            and benefits of the procedure and the sedation                            options and risks were discussed with the patient.                            All questions were answered, and informed consent                            was obtained. Prior Anticoagulants: The patient has                            taken no anticoagulant or antiplatelet agents. ASA                            Grade Assessment: II - A patient with mild systemic                            disease. After reviewing the risks and benefits,                            the patient was deemed in satisfactory condition to                            undergo the procedure.  After obtaining informed consent, the colonoscope                            was passed under direct vision. Throughout the                            procedure, the patient's blood pressure, pulse, and                            oxygen saturations were monitored continuously. The                            Olympus CF-HQ190L (Serial# [id number]) Colonoscope was                            introduced through the  anus and advanced to the the                            cecum, identified by appendiceal orifice and                            ileocecal valve. The ileocecal valve, appendiceal                            orifice, and rectum were photographed. The quality                            of the bowel preparation was good. The colonoscopy                            was performed without difficulty. The patient                            tolerated the procedure well. Scope In: 9:57:52 AM Scope Out: 10:10:37 AM Scope Withdrawal Time: 0 hours 9 minutes 21 seconds  Total Procedure Duration: 0 hours 12 minutes 45 seconds  Findings:                 The perianal and digital rectal examinations were                            normal.                           An 8 mm polyp was found in the descending colon.                            The polyp was sessile. The polyp was removed with a                            cold snare. Resection and retrieval were complete.                           A few small-mouthed diverticula were found in the  left colon. There was no evidence of diverticular                            bleeding.                           The exam was otherwise without abnormality on                            direct and retroflexion views. Complications:            No immediate complications. Estimated blood loss:                            None. Estimated Blood Loss:     Estimated blood loss: none. Impression:               - One 8 mm polyp in the descending colon, removed                            with a cold snare. Resected and retrieved.                           - Mild diverticulosis in the left colon.                           - The examination was otherwise normal on direct                            and retroflexion views. Recommendation:           - Consider repeat colonoscopy vs no repeat due to                            age after studies are complete  for surveillance                            based on pathology results.                           - Patient has a contact number available for                            emergencies. The signs and symptoms of potential                            delayed complications were discussed with the                            patient. Return to normal activities tomorrow.                            Written discharge instructions were provided to the                            patient.                           -  Resume previous diet.                           - Continue present medications.                           - Await pathology results. Ladene Artist, MD 09/15/2022 10:14:37 AM This report has been signed electronically.

## 2022-09-15 NOTE — Progress Notes (Signed)
History & Physical  Primary Care Physician:  Shirline Frees, MD Primary Gastroenterologist: Lucio Edward, MD  CHIEF COMPLAINT:  Personal history of colon polyps   HPI: Heather Wilson is a 76 y.o. female with a personal history of adenomatous colon polyps for surveillance colonoscopy    Past Medical History:  Diagnosis Date   Allergy    Anxiety    Arthritis    Cataract    Depression    GERD (gastroesophageal reflux disease)    Graves disease    Hyperlipidemia    Hypertension     Past Surgical History:  Procedure Laterality Date   COLONOSCOPY  2013   POLYPECTOMY  2005   TOTAL KNEE ARTHROPLASTY  2011   right    Prior to Admission medications   Medication Sig Start Date End Date Taking? Authorizing Provider  ALPRAZolam Duanne Moron) 0.25 MG tablet Take 0.25 mg by mouth at bedtime as needed for anxiety.   Yes [provider]  ARIPiprazole (ABILIFY) 2 MG tablet Take 2 mg by mouth daily.   Yes [provider]  citalopram (CELEXA) 20 MG tablet Take 20 mg by mouth daily.   Yes [provider]  famotidine (PEPCID) 10 MG tablet Take 10 mg by mouth 2 (two) times daily.   Yes [provider]  losartan (COZAAR) 100 MG tablet Take 100 mg by mouth daily.   Yes [provider]  metoprolol tartrate (LOPRESSOR) 25 MG tablet  08/04/16  Yes [provider]  OVER THE COUNTER MEDICATION Calcium, one tablet twice daily.   Yes [provider]  OVER THE COUNTER MEDICATION Vitamin D 3 one capsule daily.   Yes [provider]  simvastatin (ZOCOR) 20 MG tablet Take 20 mg by mouth every evening. Take 1 tablet daily 11/03/14  Yes [provider]  solifenacin (VESICARE) 10 MG tablet Take 10 mg by mouth daily.   Yes [provider]  triamcinolone cream (KENALOG) 0.1 % Apply 1 Application topically 2 (two) times daily.   Yes [provider]  dicyclomine (BENTYL) 10 MG capsule Take 1 capsule (10 mg total) by  mouth 3 (three) times daily before meals. Patient not taking: Reported on 08/15/2022 12/14/18   Ladene Artist, MD    Current Outpatient Medications  Medication Sig Dispense Refill   ALPRAZolam (XANAX) 0.25 MG tablet Take 0.25 mg by mouth at bedtime as needed for anxiety.     ARIPiprazole (ABILIFY) 2 MG tablet Take 2 mg by mouth daily.     citalopram (CELEXA) 20 MG tablet Take 20 mg by mouth daily.     famotidine (PEPCID) 10 MG tablet Take 10 mg by mouth 2 (two) times daily.     losartan (COZAAR) 100 MG tablet Take 100 mg by mouth daily.     metoprolol tartrate (LOPRESSOR) 25 MG tablet      OVER THE COUNTER MEDICATION Calcium, one tablet twice daily.     OVER THE COUNTER MEDICATION Vitamin D 3 one capsule daily.     simvastatin (ZOCOR) 20 MG tablet Take 20 mg by mouth every evening. Take 1 tablet daily  6   solifenacin (VESICARE) 10 MG tablet Take 10 mg by mouth daily.     triamcinolone cream (KENALOG) 0.1 % Apply 1 Application topically 2 (two) times daily.     dicyclomine (BENTYL) 10 MG capsule Take 1 capsule (10 mg total) by mouth 3 (three) times daily before meals. (Patient not taking: Reported on 08/15/2022) 90 capsule 11  Current Facility-Administered Medications  Medication Dose Route Frequency Provider Last Rate Last Admin   0.9 %  sodium chloride infusion  500 mL Intravenous Once Ladene Artist, MD        Allergies as of 09/15/2022 - Review Complete 09/15/2022  Allergen Reaction Noted   Codeine Anxiety 04/12/2012   Prednisone Other (See Comments) 08/12/2018    Family History  Problem Relation Age of Onset   Colon cancer Neg Hx    Stomach cancer Neg Hx    Colon polyps Neg Hx    Esophageal cancer Neg Hx    Rectal cancer Neg Hx     Social History   Socioeconomic History   Marital status: Married    Spouse name: Not on file   Number of children: Not on file   Years of education: Not on file   Highest education level: Not on file  Occupational History   Not on  file  Tobacco Use   Smoking status: Never   Smokeless tobacco: Never  Vaping Use   Vaping Use: Never used  Substance and Sexual Activity   Alcohol use: No   Drug use: No   Sexual activity: Not on file  Other Topics Concern   Not on file  Social History Narrative   Not on file   Social Determinants of Health   Financial Resource Strain: Not on file  Food Insecurity: Not on file  Transportation Needs: Not on file  Physical Activity: Not on file  Stress: Not on file  Social Connections: Not on file  Intimate Partner Violence: Not on file    Review of Systems:  All systems reviewed were negative except where noted in HPI.   Physical Exam:  General:  Alert, well-developed, in NAD Head:  Normocephalic and atraumatic. Eyes:  Sclera clear, no icterus.   Conjunctiva pink. Ears:  Normal auditory acuity. Mouth:  No deformity or lesions.  Neck:  Supple; no masses . Lungs:  Clear throughout to auscultation.   No wheezes, crackles, or rhonchi. No acute distress. Heart:  Regular rate and rhythm; no murmurs. Abdomen:  Soft, nondistended, nontender. No masses, hepatomegaly. No obvious masses.  Normal bowel .    Rectal:  Deferred   Msk:  Symmetrical without gross deformities.. Pulses:  Normal pulses noted. Extremities:  Without edema. Neurologic:  Alert and  oriented x4;  grossly normal neurologically. Skin:  Intact without significant lesions or rashes. Cervical Nodes:  No significant cervical adenopathy. Psych:  Alert and cooperative. Normal mood and affect.  Impression / Wilson:   Personal history of adenomatous colon polyps for surveillance colonoscopy.  Heather Wilson. Heather Wilson  09/15/2022, 9:47 AM See Shea Evans, [facility], to contact our on call provider

## 2022-09-15 NOTE — Progress Notes (Signed)
Called to room to assist during endoscopic procedure.  Patient ID and intended procedure confirmed with present staff. Received instructions for my participation in the procedure from the performing physician.  

## 2022-09-15 NOTE — Progress Notes (Signed)
Sedate, gd SR, tolerated procedure well, VSS, report to RN 

## 2022-09-15 NOTE — Patient Instructions (Signed)
   Handouts on polyps and diverticulosis given to you today  Await pathology results on polyp removed    YOU HAD AN ENDOSCOPIC PROCEDURE TODAY AT [facility]:   Refer to the procedure report that was given to you for any specific questions about what was found during the examination.  If the procedure report does not answer your questions, please call your gastroenterologist to clarify.  If you requested that your care partner not be given the details of your procedure findings, then the procedure report has been included in a sealed envelope for you to review at your convenience later.  YOU SHOULD EXPECT: Some feelings of bloating in the abdomen. Passage of more gas than usual.  Walking can help get rid of the air that was put into your GI tract during the procedure and reduce the bloating. If you had a lower endoscopy (such as a colonoscopy or flexible sigmoidoscopy) you may notice spotting of blood in your stool or on the toilet paper. If you underwent a bowel prep for your procedure, you may not have a normal bowel movement for a few days.  Please Note:  You might notice some irritation and congestion in your nose or some drainage.  This is from the oxygen used during your procedure.  There is no need for concern and it should clear up in a day or so.  SYMPTOMS TO REPORT IMMEDIATELY:  Following lower endoscopy (colonoscopy or flexible sigmoidoscopy):  Excessive amounts of blood in the stool  Significant tenderness or worsening of abdominal pains  Swelling of the abdomen that is new, acute  Fever of 100F or higher   For urgent or emergent issues, a gastroenterologist can be reached at any hour by calling [phone]. Do not use MyChart messaging for urgent concerns.    DIET:  We do recommend a small meal at first, but then you may proceed to your regular diet.  Drink plenty of fluids but you should avoid alcoholic beverages for 24 hours.  ACTIVITY:  You should plan  to take it easy for the rest of today and you should NOT DRIVE or use heavy machinery until tomorrow (because of the sedation medicines used during the test).    FOLLOW UP: Our staff will call the number listed on your records the next business day following your procedure.  We will call around 7:15- 8:00 am to check on you and address any questions or concerns that you may have regarding the information given to you following your procedure. If we do not reach you, we will leave a message.     If any biopsies were taken you will be contacted by phone or by letter within the next 1-3 weeks.  Please call us at [phone] if you have not heard about the biopsies in 3 weeks.    SIGNATURES/CONFIDENTIALITY: You and/or your care partner have signed paperwork which will be entered into your electronic medical record.  These signatures attest to the fact that that the information above on your After Visit Summary has been reviewed and is understood.  Full responsibility of the confidentiality of this discharge information lies with you and/or your care-partner.

## 2022-09-15 NOTE — Progress Notes (Signed)
Pt's states no medical or surgical changes since previsit or office visit. 

## 2022-09-16 ENCOUNTER — Telehealth: Payer: Self-pay

## 2022-09-16 NOTE — Telephone Encounter (Signed)
No answer, left HIPAA compliant voicemail

## 2022-09-29 DIAGNOSIS — F419 Anxiety disorder, unspecified: Secondary | ICD-10-CM | POA: Diagnosis not present

## 2022-09-29 DIAGNOSIS — E0500 Thyrotoxicosis with diffuse goiter without thyrotoxic crisis or storm: Secondary | ICD-10-CM | POA: Diagnosis not present

## 2022-09-29 DIAGNOSIS — F411 Generalized anxiety disorder: Secondary | ICD-10-CM | POA: Diagnosis not present

## 2022-09-29 DIAGNOSIS — R9431 Abnormal electrocardiogram [ECG] [EKG]: Secondary | ICD-10-CM | POA: Diagnosis not present

## 2022-09-29 DIAGNOSIS — R0989 Other specified symptoms and signs involving the circulatory and respiratory systems: Secondary | ICD-10-CM | POA: Diagnosis not present

## 2022-09-29 DIAGNOSIS — I2510 Atherosclerotic heart disease of native coronary artery without angina pectoris: Secondary | ICD-10-CM | POA: Diagnosis not present

## 2022-09-29 DIAGNOSIS — Z885 Allergy status to narcotic agent status: Secondary | ICD-10-CM | POA: Diagnosis not present

## 2022-09-29 DIAGNOSIS — I10 Essential (primary) hypertension: Secondary | ICD-10-CM | POA: Diagnosis not present

## 2022-09-29 DIAGNOSIS — Z79899 Other long term (current) drug therapy: Secondary | ICD-10-CM | POA: Diagnosis not present

## 2022-09-29 DIAGNOSIS — F302 Manic episode, severe with psychotic symptoms: Secondary | ICD-10-CM | POA: Diagnosis not present

## 2022-09-29 DIAGNOSIS — E785 Hyperlipidemia, unspecified: Secondary | ICD-10-CM | POA: Diagnosis not present

## 2022-09-29 DIAGNOSIS — I4510 Unspecified right bundle-branch block: Secondary | ICD-10-CM | POA: Diagnosis not present

## 2022-09-29 DIAGNOSIS — J9811 Atelectasis: Secondary | ICD-10-CM | POA: Diagnosis not present

## 2022-09-29 DIAGNOSIS — R9082 White matter disease, unspecified: Secondary | ICD-10-CM | POA: Diagnosis not present

## 2022-09-29 DIAGNOSIS — I517 Cardiomegaly: Secondary | ICD-10-CM | POA: Diagnosis not present

## 2022-09-29 DIAGNOSIS — E7800 Pure hypercholesterolemia, unspecified: Secondary | ICD-10-CM | POA: Diagnosis not present

## 2022-09-29 DIAGNOSIS — F3189 Other bipolar disorder: Secondary | ICD-10-CM | POA: Diagnosis not present

## 2022-09-29 DIAGNOSIS — F39 Unspecified mood [affective] disorder: Secondary | ICD-10-CM | POA: Diagnosis not present

## 2022-09-29 DIAGNOSIS — F29 Unspecified psychosis not due to a substance or known physiological condition: Secondary | ICD-10-CM | POA: Diagnosis not present

## 2022-09-30 DIAGNOSIS — F29 Unspecified psychosis not due to a substance or known physiological condition: Secondary | ICD-10-CM | POA: Diagnosis not present

## 2022-10-01 DIAGNOSIS — R9431 Abnormal electrocardiogram [ECG] [EKG]: Secondary | ICD-10-CM | POA: Diagnosis not present

## 2022-10-01 DIAGNOSIS — I10 Essential (primary) hypertension: Secondary | ICD-10-CM | POA: Diagnosis not present

## 2022-10-01 DIAGNOSIS — E0500 Thyrotoxicosis with diffuse goiter without thyrotoxic crisis or storm: Secondary | ICD-10-CM | POA: Diagnosis not present

## 2022-10-01 DIAGNOSIS — E7800 Pure hypercholesterolemia, unspecified: Secondary | ICD-10-CM | POA: Diagnosis not present

## 2022-10-01 DIAGNOSIS — F3189 Other bipolar disorder: Secondary | ICD-10-CM | POA: Diagnosis not present

## 2022-10-02 DIAGNOSIS — F3189 Other bipolar disorder: Secondary | ICD-10-CM | POA: Diagnosis not present

## 2022-10-03 DIAGNOSIS — E7800 Pure hypercholesterolemia, unspecified: Secondary | ICD-10-CM | POA: Diagnosis not present

## 2022-10-03 DIAGNOSIS — F3189 Other bipolar disorder: Secondary | ICD-10-CM | POA: Diagnosis not present

## 2022-10-03 DIAGNOSIS — R9431 Abnormal electrocardiogram [ECG] [EKG]: Secondary | ICD-10-CM | POA: Diagnosis not present

## 2022-10-03 DIAGNOSIS — I10 Essential (primary) hypertension: Secondary | ICD-10-CM | POA: Diagnosis not present

## 2022-10-03 DIAGNOSIS — E0500 Thyrotoxicosis with diffuse goiter without thyrotoxic crisis or storm: Secondary | ICD-10-CM | POA: Diagnosis not present

## 2022-10-05 DIAGNOSIS — F29 Unspecified psychosis not due to a substance or known physiological condition: Secondary | ICD-10-CM | POA: Diagnosis not present

## 2022-10-05 DIAGNOSIS — E7800 Pure hypercholesterolemia, unspecified: Secondary | ICD-10-CM | POA: Diagnosis not present

## 2022-10-05 DIAGNOSIS — E0500 Thyrotoxicosis with diffuse goiter without thyrotoxic crisis or storm: Secondary | ICD-10-CM | POA: Diagnosis not present

## 2022-10-05 DIAGNOSIS — F3189 Other bipolar disorder: Secondary | ICD-10-CM | POA: Diagnosis not present

## 2022-10-05 DIAGNOSIS — R9431 Abnormal electrocardiogram [ECG] [EKG]: Secondary | ICD-10-CM | POA: Diagnosis not present

## 2022-10-05 DIAGNOSIS — I10 Essential (primary) hypertension: Secondary | ICD-10-CM | POA: Diagnosis not present

## 2022-10-07 ENCOUNTER — Encounter: Payer: Self-pay | Admitting: Gastroenterology

## 2022-10-07 DIAGNOSIS — E0500 Thyrotoxicosis with diffuse goiter without thyrotoxic crisis or storm: Secondary | ICD-10-CM | POA: Diagnosis not present

## 2022-10-07 DIAGNOSIS — E7800 Pure hypercholesterolemia, unspecified: Secondary | ICD-10-CM | POA: Diagnosis not present

## 2022-10-07 DIAGNOSIS — I10 Essential (primary) hypertension: Secondary | ICD-10-CM | POA: Diagnosis not present

## 2022-10-07 DIAGNOSIS — R9431 Abnormal electrocardiogram [ECG] [EKG]: Secondary | ICD-10-CM | POA: Diagnosis not present

## 2022-10-13 DIAGNOSIS — Z09 Encounter for follow-up examination after completed treatment for conditions other than malignant neoplasm: Secondary | ICD-10-CM | POA: Diagnosis not present

## 2022-10-13 DIAGNOSIS — F319 Bipolar disorder, unspecified: Secondary | ICD-10-CM | POA: Diagnosis not present

## 2022-11-27 DIAGNOSIS — F3162 Bipolar disorder, current episode mixed, moderate: Secondary | ICD-10-CM | POA: Diagnosis not present

## 2022-11-27 DIAGNOSIS — F411 Generalized anxiety disorder: Secondary | ICD-10-CM | POA: Diagnosis not present

## 2022-12-22 DIAGNOSIS — Z1231 Encounter for screening mammogram for malignant neoplasm of breast: Secondary | ICD-10-CM | POA: Diagnosis not present

## 2022-12-26 DIAGNOSIS — R7303 Prediabetes: Secondary | ICD-10-CM | POA: Diagnosis not present

## 2022-12-26 DIAGNOSIS — E7800 Pure hypercholesterolemia, unspecified: Secondary | ICD-10-CM | POA: Diagnosis not present

## 2022-12-26 DIAGNOSIS — I10 Essential (primary) hypertension: Secondary | ICD-10-CM | POA: Diagnosis not present

## 2022-12-26 DIAGNOSIS — F319 Bipolar disorder, unspecified: Secondary | ICD-10-CM | POA: Diagnosis not present

## 2022-12-26 DIAGNOSIS — N3281 Overactive bladder: Secondary | ICD-10-CM | POA: Diagnosis not present

## 2023-01-08 DIAGNOSIS — F3162 Bipolar disorder, current episode mixed, moderate: Secondary | ICD-10-CM | POA: Diagnosis not present

## 2023-01-08 DIAGNOSIS — F411 Generalized anxiety disorder: Secondary | ICD-10-CM | POA: Diagnosis not present

## 2023-04-08 DIAGNOSIS — F3162 Bipolar disorder, current episode mixed, moderate: Secondary | ICD-10-CM | POA: Diagnosis not present

## 2023-04-08 DIAGNOSIS — F411 Generalized anxiety disorder: Secondary | ICD-10-CM | POA: Diagnosis not present

## 2023-05-20 DIAGNOSIS — F411 Generalized anxiety disorder: Secondary | ICD-10-CM | POA: Diagnosis not present

## 2023-05-20 DIAGNOSIS — F3162 Bipolar disorder, current episode mixed, moderate: Secondary | ICD-10-CM | POA: Diagnosis not present

## 2023-06-30 DIAGNOSIS — E7800 Pure hypercholesterolemia, unspecified: Secondary | ICD-10-CM | POA: Diagnosis not present

## 2023-06-30 DIAGNOSIS — I10 Essential (primary) hypertension: Secondary | ICD-10-CM | POA: Diagnosis not present

## 2023-06-30 DIAGNOSIS — N3281 Overactive bladder: Secondary | ICD-10-CM | POA: Diagnosis not present

## 2023-06-30 DIAGNOSIS — R7303 Prediabetes: Secondary | ICD-10-CM | POA: Diagnosis not present

## 2023-06-30 DIAGNOSIS — F319 Bipolar disorder, unspecified: Secondary | ICD-10-CM | POA: Diagnosis not present

## 2023-06-30 DIAGNOSIS — Z0000 Encounter for general adult medical examination without abnormal findings: Secondary | ICD-10-CM | POA: Diagnosis not present

## 2023-06-30 DIAGNOSIS — F259 Schizoaffective disorder, unspecified: Secondary | ICD-10-CM | POA: Diagnosis not present

## 2023-08-19 DIAGNOSIS — F3162 Bipolar disorder, current episode mixed, moderate: Secondary | ICD-10-CM | POA: Diagnosis not present

## 2023-08-19 DIAGNOSIS — F411 Generalized anxiety disorder: Secondary | ICD-10-CM | POA: Diagnosis not present

## 2023-10-13 DIAGNOSIS — H25813 Combined forms of age-related cataract, bilateral: Secondary | ICD-10-CM | POA: Diagnosis not present

## 2023-10-13 DIAGNOSIS — H52223 Regular astigmatism, bilateral: Secondary | ICD-10-CM | POA: Diagnosis not present

## 2023-11-01 DIAGNOSIS — K219 Gastro-esophageal reflux disease without esophagitis: Secondary | ICD-10-CM | POA: Diagnosis not present

## 2023-11-01 DIAGNOSIS — R053 Chronic cough: Secondary | ICD-10-CM | POA: Diagnosis not present

## 2023-11-01 DIAGNOSIS — R300 Dysuria: Secondary | ICD-10-CM | POA: Diagnosis not present

## 2023-12-28 ENCOUNTER — Telehealth: Payer: Self-pay | Admitting: Gastroenterology

## 2023-12-28 NOTE — Telephone Encounter (Signed)
 Patient husband called and stated that he would like his wife to be schedule with Dr. Doy Hutching whenever it is time for her to have her next colonoscopy procedure. Please advise.

## 2023-12-31 DIAGNOSIS — I10 Essential (primary) hypertension: Secondary | ICD-10-CM | POA: Diagnosis not present

## 2023-12-31 DIAGNOSIS — K219 Gastro-esophageal reflux disease without esophagitis: Secondary | ICD-10-CM | POA: Diagnosis not present

## 2023-12-31 DIAGNOSIS — E7800 Pure hypercholesterolemia, unspecified: Secondary | ICD-10-CM | POA: Diagnosis not present

## 2023-12-31 DIAGNOSIS — N3281 Overactive bladder: Secondary | ICD-10-CM | POA: Diagnosis not present

## 2023-12-31 DIAGNOSIS — F319 Bipolar disorder, unspecified: Secondary | ICD-10-CM | POA: Diagnosis not present

## 2023-12-31 DIAGNOSIS — R7303 Prediabetes: Secondary | ICD-10-CM | POA: Diagnosis not present

## 2024-01-08 DIAGNOSIS — F1511 Other stimulant abuse, in remission: Secondary | ICD-10-CM | POA: Diagnosis not present

## 2024-01-14 DIAGNOSIS — F3112 Bipolar disorder, current episode manic without psychotic features, moderate: Secondary | ICD-10-CM | POA: Diagnosis not present

## 2024-01-15 DIAGNOSIS — H2512 Age-related nuclear cataract, left eye: Secondary | ICD-10-CM | POA: Diagnosis not present

## 2024-01-15 DIAGNOSIS — H40013 Open angle with borderline findings, low risk, bilateral: Secondary | ICD-10-CM | POA: Diagnosis not present

## 2024-01-15 DIAGNOSIS — H25013 Cortical age-related cataract, bilateral: Secondary | ICD-10-CM | POA: Diagnosis not present

## 2024-01-15 DIAGNOSIS — H25043 Posterior subcapsular polar age-related cataract, bilateral: Secondary | ICD-10-CM | POA: Diagnosis not present

## 2024-01-15 DIAGNOSIS — H2513 Age-related nuclear cataract, bilateral: Secondary | ICD-10-CM | POA: Diagnosis not present

## 2024-01-15 DIAGNOSIS — H18413 Arcus senilis, bilateral: Secondary | ICD-10-CM | POA: Diagnosis not present

## 2024-02-03 DIAGNOSIS — Z1231 Encounter for screening mammogram for malignant neoplasm of breast: Secondary | ICD-10-CM | POA: Diagnosis not present

## 2024-02-03 DIAGNOSIS — Z01419 Encounter for gynecological examination (general) (routine) without abnormal findings: Secondary | ICD-10-CM | POA: Diagnosis not present

## 2024-02-25 DIAGNOSIS — F3112 Bipolar disorder, current episode manic without psychotic features, moderate: Secondary | ICD-10-CM | POA: Diagnosis not present

## 2024-03-07 DIAGNOSIS — H2512 Age-related nuclear cataract, left eye: Secondary | ICD-10-CM | POA: Diagnosis not present

## 2024-03-08 DIAGNOSIS — H2511 Age-related nuclear cataract, right eye: Secondary | ICD-10-CM | POA: Diagnosis not present

## 2024-03-29 DIAGNOSIS — F22 Delusional disorders: Secondary | ICD-10-CM | POA: Diagnosis not present

## 2024-03-29 DIAGNOSIS — R4182 Altered mental status, unspecified: Secondary | ICD-10-CM | POA: Diagnosis not present

## 2024-03-29 DIAGNOSIS — F250 Schizoaffective disorder, bipolar type: Secondary | ICD-10-CM | POA: Diagnosis not present

## 2024-03-29 DIAGNOSIS — Z885 Allergy status to narcotic agent status: Secondary | ICD-10-CM | POA: Diagnosis not present

## 2024-03-29 DIAGNOSIS — F312 Bipolar disorder, current episode manic severe with psychotic features: Secondary | ICD-10-CM | POA: Diagnosis not present

## 2024-03-29 DIAGNOSIS — F39 Unspecified mood [affective] disorder: Secondary | ICD-10-CM | POA: Diagnosis not present

## 2024-03-29 DIAGNOSIS — F3189 Other bipolar disorder: Secondary | ICD-10-CM | POA: Diagnosis not present

## 2024-03-29 DIAGNOSIS — F29 Unspecified psychosis not due to a substance or known physiological condition: Secondary | ICD-10-CM | POA: Diagnosis not present

## 2024-03-30 DIAGNOSIS — R9401 Abnormal electroencephalogram [EEG]: Secondary | ICD-10-CM | POA: Diagnosis not present

## 2024-03-30 DIAGNOSIS — F29 Unspecified psychosis not due to a substance or known physiological condition: Secondary | ICD-10-CM | POA: Diagnosis not present

## 2024-03-30 DIAGNOSIS — E7800 Pure hypercholesterolemia, unspecified: Secondary | ICD-10-CM | POA: Diagnosis not present

## 2024-03-30 DIAGNOSIS — F411 Generalized anxiety disorder: Secondary | ICD-10-CM | POA: Diagnosis not present

## 2024-03-30 DIAGNOSIS — I10 Essential (primary) hypertension: Secondary | ICD-10-CM | POA: Diagnosis not present

## 2024-03-30 DIAGNOSIS — E785 Hyperlipidemia, unspecified: Secondary | ICD-10-CM | POA: Diagnosis not present

## 2024-03-30 DIAGNOSIS — N390 Urinary tract infection, site not specified: Secondary | ICD-10-CM | POA: Diagnosis not present

## 2024-03-30 DIAGNOSIS — F3164 Bipolar disorder, current episode mixed, severe, with psychotic features: Secondary | ICD-10-CM | POA: Diagnosis not present

## 2024-03-30 DIAGNOSIS — Z79899 Other long term (current) drug therapy: Secondary | ICD-10-CM | POA: Diagnosis not present

## 2024-03-30 DIAGNOSIS — G4700 Insomnia, unspecified: Secondary | ICD-10-CM | POA: Diagnosis not present

## 2024-03-30 DIAGNOSIS — K219 Gastro-esophageal reflux disease without esophagitis: Secondary | ICD-10-CM | POA: Diagnosis not present

## 2024-03-31 DIAGNOSIS — I10 Essential (primary) hypertension: Secondary | ICD-10-CM | POA: Diagnosis not present

## 2024-03-31 DIAGNOSIS — K219 Gastro-esophageal reflux disease without esophagitis: Secondary | ICD-10-CM | POA: Diagnosis not present

## 2024-03-31 DIAGNOSIS — F3164 Bipolar disorder, current episode mixed, severe, with psychotic features: Secondary | ICD-10-CM | POA: Diagnosis not present

## 2024-03-31 DIAGNOSIS — R9401 Abnormal electroencephalogram [EEG]: Secondary | ICD-10-CM | POA: Diagnosis not present

## 2024-03-31 DIAGNOSIS — E7800 Pure hypercholesterolemia, unspecified: Secondary | ICD-10-CM | POA: Diagnosis not present

## 2024-04-01 DIAGNOSIS — F3164 Bipolar disorder, current episode mixed, severe, with psychotic features: Secondary | ICD-10-CM | POA: Diagnosis not present

## 2024-04-01 DIAGNOSIS — K219 Gastro-esophageal reflux disease without esophagitis: Secondary | ICD-10-CM | POA: Diagnosis not present

## 2024-04-01 DIAGNOSIS — R9401 Abnormal electroencephalogram [EEG]: Secondary | ICD-10-CM | POA: Diagnosis not present

## 2024-04-01 DIAGNOSIS — I10 Essential (primary) hypertension: Secondary | ICD-10-CM | POA: Diagnosis not present

## 2024-04-01 DIAGNOSIS — E7800 Pure hypercholesterolemia, unspecified: Secondary | ICD-10-CM | POA: Diagnosis not present

## 2024-04-02 DIAGNOSIS — F3164 Bipolar disorder, current episode mixed, severe, with psychotic features: Secondary | ICD-10-CM | POA: Diagnosis not present

## 2024-04-03 DIAGNOSIS — F3164 Bipolar disorder, current episode mixed, severe, with psychotic features: Secondary | ICD-10-CM | POA: Diagnosis not present

## 2024-04-04 DIAGNOSIS — F3164 Bipolar disorder, current episode mixed, severe, with psychotic features: Secondary | ICD-10-CM | POA: Diagnosis not present

## 2024-04-05 DIAGNOSIS — F3164 Bipolar disorder, current episode mixed, severe, with psychotic features: Secondary | ICD-10-CM | POA: Diagnosis not present

## 2024-04-06 DIAGNOSIS — F3164 Bipolar disorder, current episode mixed, severe, with psychotic features: Secondary | ICD-10-CM | POA: Diagnosis not present

## 2024-04-07 DIAGNOSIS — F3164 Bipolar disorder, current episode mixed, severe, with psychotic features: Secondary | ICD-10-CM | POA: Diagnosis not present

## 2024-04-08 DIAGNOSIS — F3164 Bipolar disorder, current episode mixed, severe, with psychotic features: Secondary | ICD-10-CM | POA: Diagnosis not present

## 2024-04-09 DIAGNOSIS — F3164 Bipolar disorder, current episode mixed, severe, with psychotic features: Secondary | ICD-10-CM | POA: Diagnosis not present

## 2024-04-12 DIAGNOSIS — F312 Bipolar disorder, current episode manic severe with psychotic features: Secondary | ICD-10-CM | POA: Diagnosis not present

## 2024-04-18 DIAGNOSIS — Z79899 Other long term (current) drug therapy: Secondary | ICD-10-CM | POA: Diagnosis not present

## 2024-04-18 DIAGNOSIS — F3112 Bipolar disorder, current episode manic without psychotic features, moderate: Secondary | ICD-10-CM | POA: Diagnosis not present

## 2024-04-25 DIAGNOSIS — F312 Bipolar disorder, current episode manic severe with psychotic features: Secondary | ICD-10-CM | POA: Diagnosis not present

## 2024-05-02 DIAGNOSIS — H2511 Age-related nuclear cataract, right eye: Secondary | ICD-10-CM | POA: Diagnosis not present

## 2024-05-03 DIAGNOSIS — H52223 Regular astigmatism, bilateral: Secondary | ICD-10-CM | POA: Diagnosis not present

## 2024-05-03 DIAGNOSIS — H2511 Age-related nuclear cataract, right eye: Secondary | ICD-10-CM | POA: Diagnosis not present

## 2024-05-03 DIAGNOSIS — H25813 Combined forms of age-related cataract, bilateral: Secondary | ICD-10-CM | POA: Diagnosis not present

## 2024-05-03 DIAGNOSIS — H2512 Age-related nuclear cataract, left eye: Secondary | ICD-10-CM | POA: Diagnosis not present

## 2024-05-23 DIAGNOSIS — F312 Bipolar disorder, current episode manic severe with psychotic features: Secondary | ICD-10-CM | POA: Diagnosis not present

## 2024-05-30 ENCOUNTER — Ambulatory Visit: Admitting: Obstetrics

## 2024-06-09 DIAGNOSIS — I10 Essential (primary) hypertension: Secondary | ICD-10-CM | POA: Diagnosis not present

## 2024-06-09 DIAGNOSIS — N3281 Overactive bladder: Secondary | ICD-10-CM | POA: Diagnosis not present

## 2024-06-09 DIAGNOSIS — K219 Gastro-esophageal reflux disease without esophagitis: Secondary | ICD-10-CM | POA: Diagnosis not present

## 2024-06-09 DIAGNOSIS — F3170 Bipolar disorder, currently in remission, most recent episode unspecified: Secondary | ICD-10-CM | POA: Diagnosis not present

## 2024-06-09 DIAGNOSIS — R7303 Prediabetes: Secondary | ICD-10-CM | POA: Diagnosis not present

## 2024-06-09 DIAGNOSIS — E7800 Pure hypercholesterolemia, unspecified: Secondary | ICD-10-CM | POA: Diagnosis not present

## 2024-06-09 DIAGNOSIS — Z0000 Encounter for general adult medical examination without abnormal findings: Secondary | ICD-10-CM | POA: Diagnosis not present

## 2024-06-13 DIAGNOSIS — F312 Bipolar disorder, current episode manic severe with psychotic features: Secondary | ICD-10-CM | POA: Diagnosis not present

## 2024-08-15 DIAGNOSIS — F312 Bipolar disorder, current episode manic severe with psychotic features: Secondary | ICD-10-CM | POA: Diagnosis not present

## 2024-08-18 DIAGNOSIS — R2689 Other abnormalities of gait and mobility: Secondary | ICD-10-CM | POA: Diagnosis not present

## 2024-08-18 DIAGNOSIS — F3170 Bipolar disorder, currently in remission, most recent episode unspecified: Secondary | ICD-10-CM | POA: Diagnosis not present

## 2024-08-18 DIAGNOSIS — I10 Essential (primary) hypertension: Secondary | ICD-10-CM | POA: Diagnosis not present

## 2024-08-22 DIAGNOSIS — F312 Bipolar disorder, current episode manic severe with psychotic features: Secondary | ICD-10-CM | POA: Diagnosis not present

## 2024-08-29 DIAGNOSIS — F312 Bipolar disorder, current episode manic severe with psychotic features: Secondary | ICD-10-CM | POA: Diagnosis not present

## 2024-09-21 DIAGNOSIS — N182 Chronic kidney disease, stage 2 (mild): Secondary | ICD-10-CM | POA: Diagnosis not present

## 2024-09-21 DIAGNOSIS — M810 Age-related osteoporosis without current pathological fracture: Secondary | ICD-10-CM | POA: Diagnosis not present

## 2024-09-21 DIAGNOSIS — K219 Gastro-esophageal reflux disease without esophagitis: Secondary | ICD-10-CM | POA: Diagnosis not present

## 2024-09-21 DIAGNOSIS — F259 Schizoaffective disorder, unspecified: Secondary | ICD-10-CM | POA: Diagnosis not present

## 2024-09-21 DIAGNOSIS — F319 Bipolar disorder, unspecified: Secondary | ICD-10-CM | POA: Diagnosis not present

## 2024-09-21 DIAGNOSIS — Z9841 Cataract extraction status, right eye: Secondary | ICD-10-CM | POA: Diagnosis not present

## 2024-09-21 DIAGNOSIS — I25119 Atherosclerotic heart disease of native coronary artery with unspecified angina pectoris: Secondary | ICD-10-CM | POA: Diagnosis not present

## 2024-09-21 DIAGNOSIS — E669 Obesity, unspecified: Secondary | ICD-10-CM | POA: Diagnosis not present

## 2024-09-21 DIAGNOSIS — N3941 Urge incontinence: Secondary | ICD-10-CM | POA: Diagnosis not present

## 2024-09-21 DIAGNOSIS — Z6833 Body mass index (BMI) 33.0-33.9, adult: Secondary | ICD-10-CM | POA: Diagnosis not present

## 2024-09-21 DIAGNOSIS — R739 Hyperglycemia, unspecified: Secondary | ICD-10-CM | POA: Diagnosis not present

## 2024-09-21 DIAGNOSIS — E039 Hypothyroidism, unspecified: Secondary | ICD-10-CM | POA: Diagnosis not present

## 2024-09-21 DIAGNOSIS — F411 Generalized anxiety disorder: Secondary | ICD-10-CM | POA: Diagnosis not present

## 2024-09-21 DIAGNOSIS — F22 Delusional disorders: Secondary | ICD-10-CM | POA: Diagnosis not present

## 2024-09-21 DIAGNOSIS — F1321 Sedative, hypnotic or anxiolytic dependence, in remission: Secondary | ICD-10-CM | POA: Diagnosis not present

## 2024-09-21 DIAGNOSIS — E0500 Thyrotoxicosis with diffuse goiter without thyrotoxic crisis or storm: Secondary | ICD-10-CM | POA: Diagnosis not present

## 2024-09-21 DIAGNOSIS — G3184 Mild cognitive impairment, so stated: Secondary | ICD-10-CM | POA: Diagnosis not present

## 2024-09-21 DIAGNOSIS — E785 Hyperlipidemia, unspecified: Secondary | ICD-10-CM | POA: Diagnosis not present

## 2024-09-22 DIAGNOSIS — R944 Abnormal results of kidney function studies: Secondary | ICD-10-CM | POA: Diagnosis not present

## 2024-09-22 DIAGNOSIS — D473 Essential (hemorrhagic) thrombocythemia: Secondary | ICD-10-CM | POA: Diagnosis not present

## 2024-10-06 DIAGNOSIS — R748 Abnormal levels of other serum enzymes: Secondary | ICD-10-CM | POA: Diagnosis not present

## 2024-10-11 ENCOUNTER — Other Ambulatory Visit: Payer: Self-pay | Admitting: Family Medicine

## 2024-10-11 DIAGNOSIS — R748 Abnormal levels of other serum enzymes: Secondary | ICD-10-CM

## 2024-10-20 ENCOUNTER — Inpatient Hospital Stay: Admission: RE | Admit: 2024-10-20

## 2024-10-20 DIAGNOSIS — R748 Abnormal levels of other serum enzymes: Secondary | ICD-10-CM

## 2024-12-15 ENCOUNTER — Ambulatory Visit: Admitting: Gastroenterology
